# Patient Record
Sex: Female | Born: 1950 | Race: Black or African American | Hispanic: No | Marital: Married | Smoking: Never smoker
Health system: Southern US, Community
[De-identification: ages and names within clinical notes are randomized; demographics above are authoritative.]

## PROBLEM LIST (undated history)

## (undated) DIAGNOSIS — I10 Essential (primary) hypertension: Secondary | ICD-10-CM

## (undated) DIAGNOSIS — D649 Anemia, unspecified: Secondary | ICD-10-CM

## (undated) DIAGNOSIS — I2510 Atherosclerotic heart disease of native coronary artery without angina pectoris: Secondary | ICD-10-CM

## (undated) DIAGNOSIS — E785 Hyperlipidemia, unspecified: Secondary | ICD-10-CM

## (undated) DIAGNOSIS — R51 Headache: Secondary | ICD-10-CM

## (undated) DIAGNOSIS — M1990 Unspecified osteoarthritis, unspecified site: Secondary | ICD-10-CM

## (undated) DIAGNOSIS — R011 Cardiac murmur, unspecified: Secondary | ICD-10-CM

## (undated) DIAGNOSIS — Z460 Encounter for fitting and adjustment of spectacles and contact lenses: Secondary | ICD-10-CM

## (undated) DIAGNOSIS — R079 Chest pain, unspecified: Secondary | ICD-10-CM

## (undated) HISTORY — PX: BUNIONECTOMY: SHX129

## (undated) HISTORY — DX: Anemia, unspecified: D64.9

## (undated) HISTORY — PX: COLONOSCOPY: SHX174

## (undated) HISTORY — DX: Atherosclerotic heart disease of native coronary artery without angina pectoris: I25.10

---

## 2002-10-10 ENCOUNTER — Encounter: Payer: Self-pay | Admitting: Emergency Medicine

## 2002-10-10 ENCOUNTER — Inpatient Hospital Stay (HOSPITAL_COMMUNITY): Admission: EM | Admit: 2002-10-10 | Discharge: 2002-10-12 | Payer: Self-pay | Admitting: Emergency Medicine

## 2002-10-13 ENCOUNTER — Emergency Department (HOSPITAL_COMMUNITY): Admission: EM | Admit: 2002-10-13 | Discharge: 2002-10-13 | Payer: Self-pay | Admitting: Emergency Medicine

## 2004-09-07 ENCOUNTER — Encounter: Admission: RE | Admit: 2004-09-07 | Discharge: 2004-09-07 | Payer: Self-pay | Admitting: Internal Medicine

## 2004-10-08 ENCOUNTER — Emergency Department (HOSPITAL_COMMUNITY): Admission: EM | Admit: 2004-10-08 | Discharge: 2004-10-09 | Payer: Self-pay | Admitting: Emergency Medicine

## 2004-10-24 HISTORY — PX: HAMMERTOE RECONSTRUCTION WITH WEIL OSTEOTOMY: SHX5631

## 2005-10-24 HISTORY — PX: CARDIAC CATHETERIZATION: SHX172

## 2006-03-14 IMAGING — CR DG CHEST 2V
2 series · 2 of 2 positions shown · non-contrast
Comparison: none

CLINICAL DATA: Chest pain.  
 CHEST ? TWO VIEWS 10/08/04:
 The heart size and mediastinal contours are normal. The lungs are clear. The visualized skeleton is unremarkable.

[view not recorded (1 of 2)]
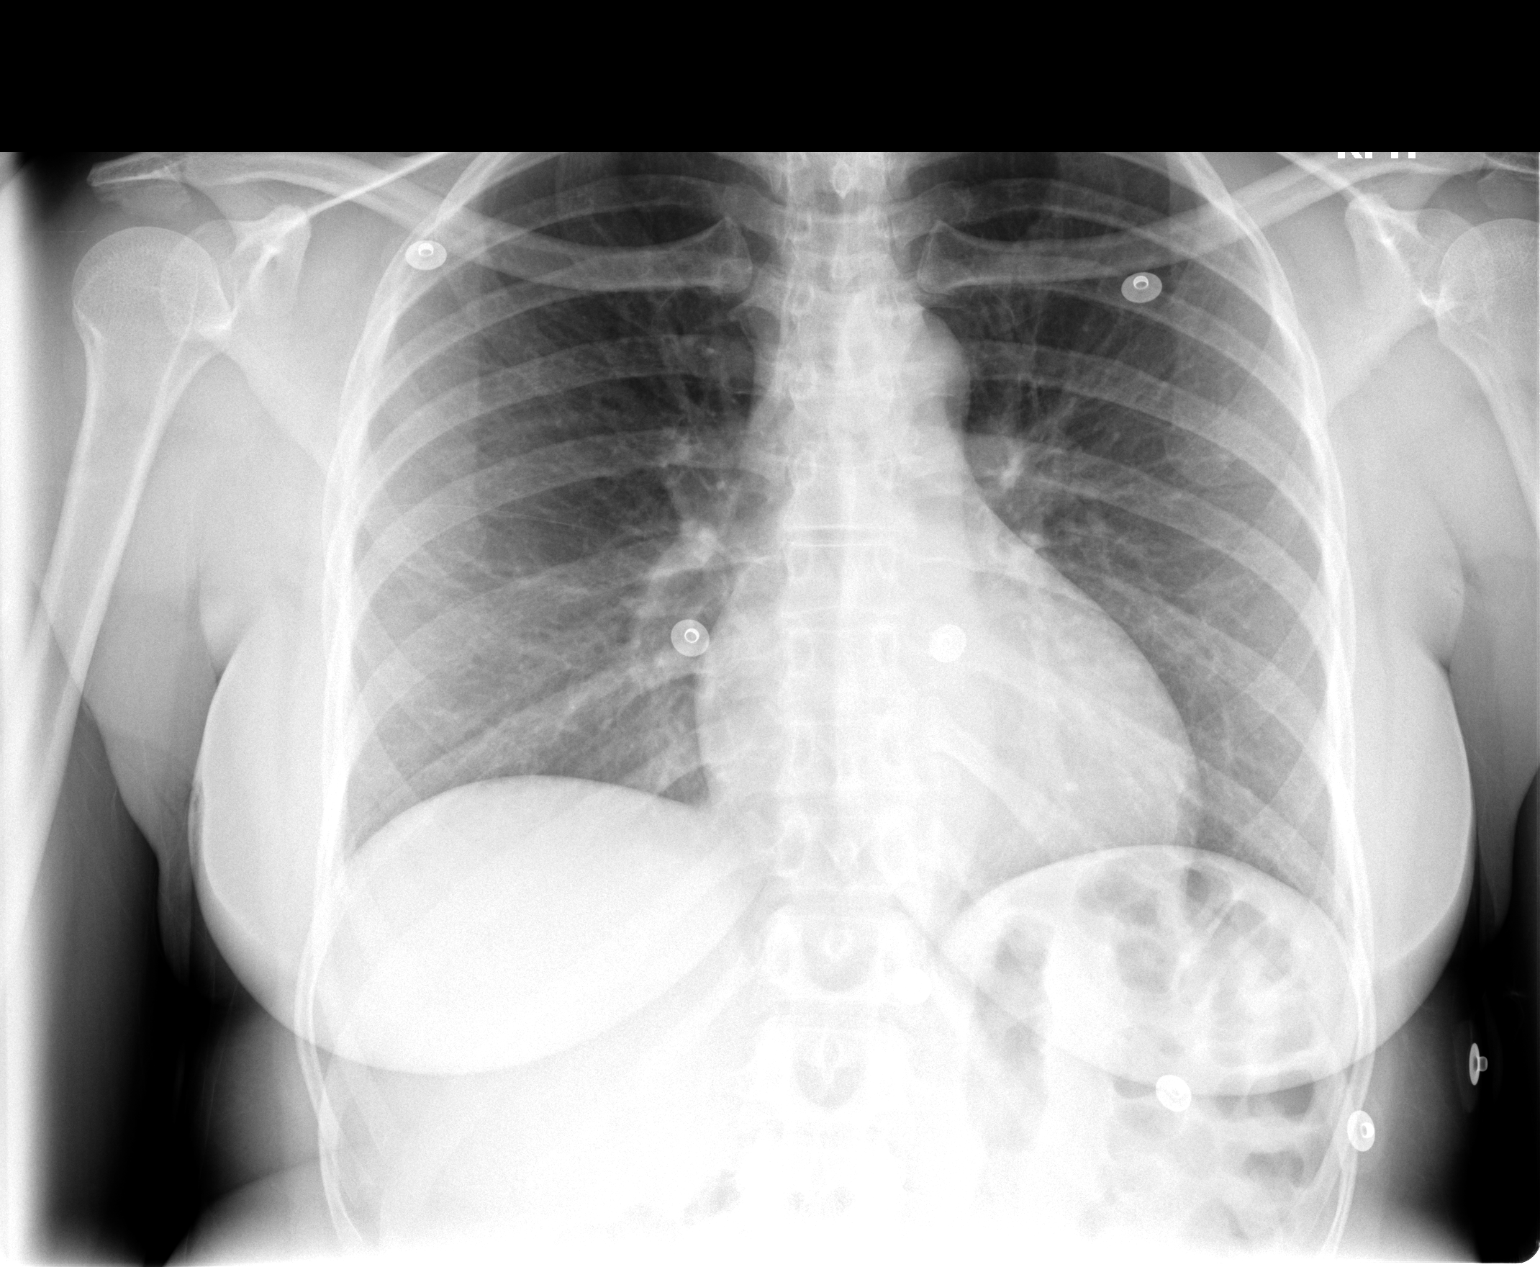

[view not recorded (2 of 2)]
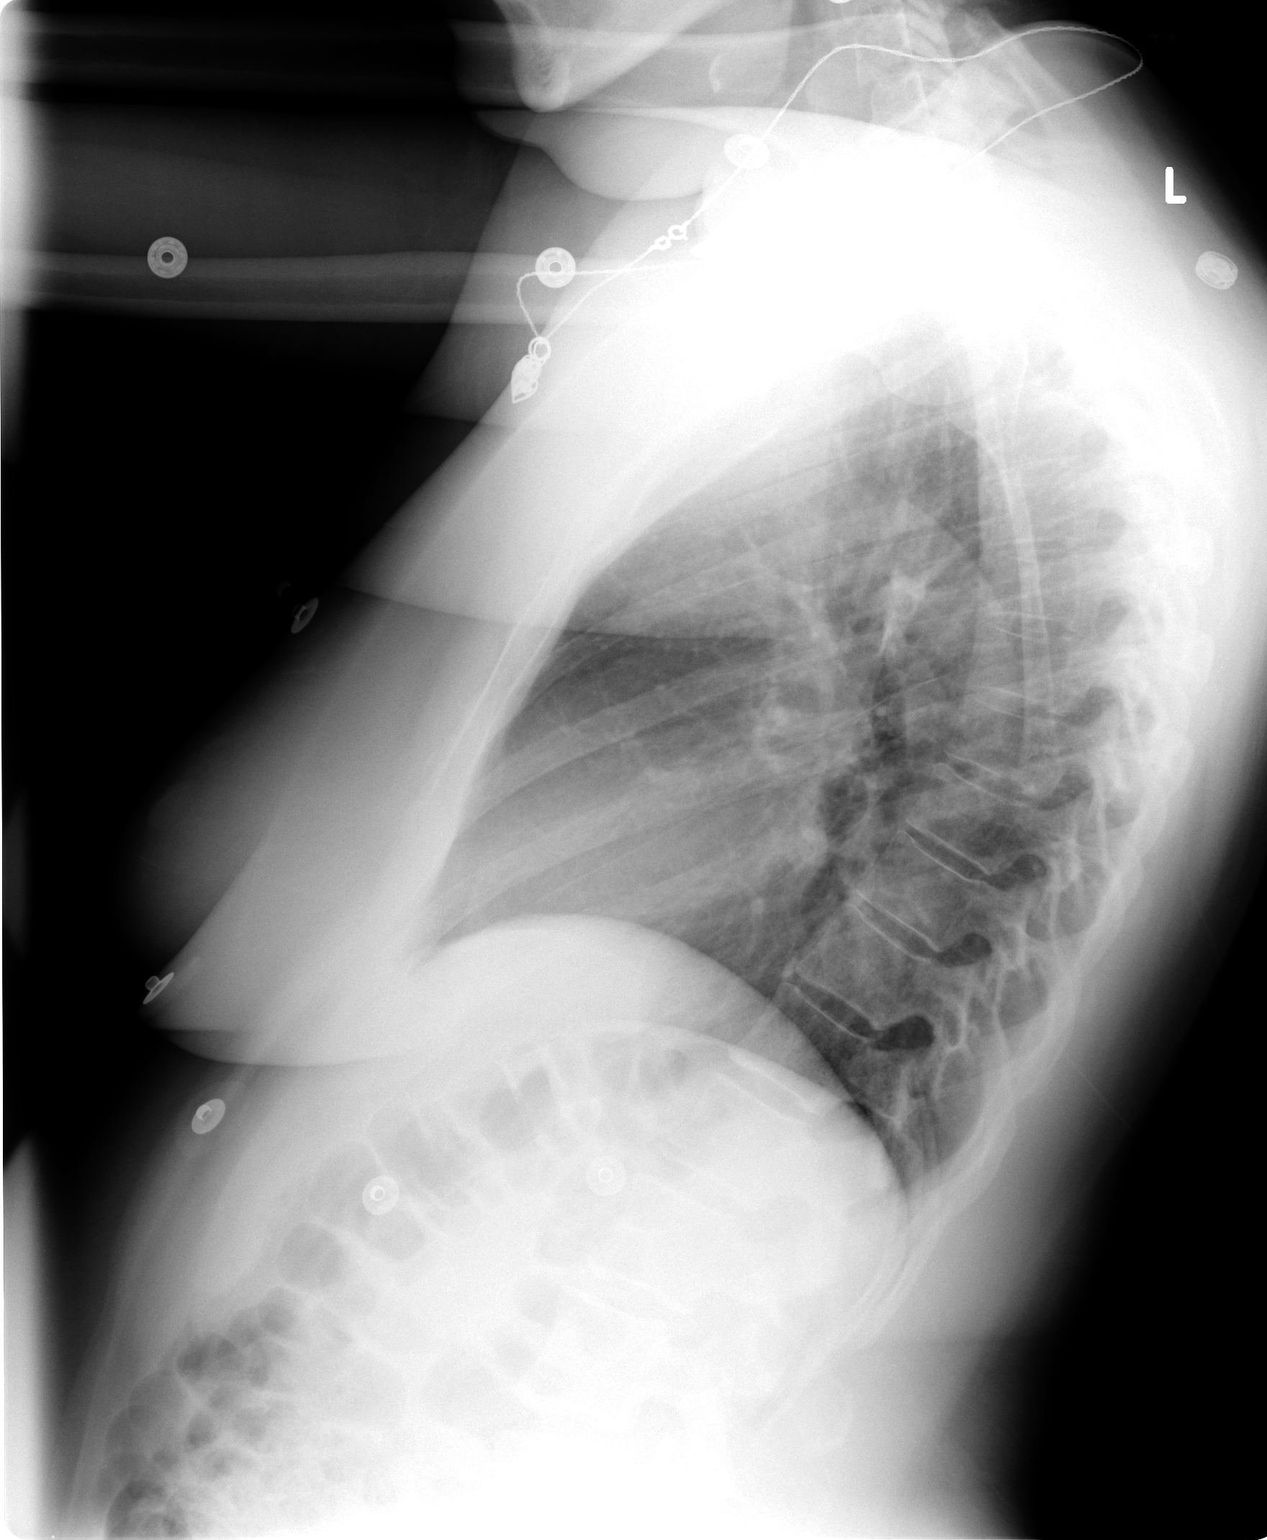

[2 of 2 positions shown; findings below may reference images not displayed]

IMPRESSION: No active disease.

## 2010-06-14 ENCOUNTER — Emergency Department (HOSPITAL_COMMUNITY): Admission: EM | Admit: 2010-06-14 | Discharge: 2010-06-14 | Payer: Self-pay | Admitting: Emergency Medicine

## 2011-01-07 LAB — BASIC METABOLIC PANEL
BUN: 15 mg/dL (ref 6–23)
CO2: 21 mEq/L (ref 19–32)
Calcium: 9.4 mg/dL (ref 8.4–10.5)
Chloride: 110 mEq/L (ref 96–112)
Creatinine, Ser: 0.71 mg/dL (ref 0.4–1.2)
GFR calc Af Amer: >60 mL/min (ref >60)
GFR calc non Af Amer: >60 mL/min (ref >60)
Glucose, Bld: 83 mg/dL (ref 70–99)
Potassium: 3.4 mEq/L — ABNORMAL LOW (ref 3.5–5.1)
Sodium: 137 mEq/L (ref 135–145)

## 2011-01-07 LAB — CBC
HCT: 35.8 % — ABNORMAL LOW (ref 36.0–46.0)
Hemoglobin: 11.8 g/dL — ABNORMAL LOW (ref 12.0–15.0)
MCH: 27.5 pg (ref 26.0–34.0)
MCHC: 32.8 g/dL (ref 30.0–36.0)
MCV: 83.8 fL (ref 78.0–100.0)
Platelets: 461 10*3/uL — ABNORMAL HIGH (ref 150–400)
RBC: 4.27 MIL/uL (ref 3.87–5.11)
RDW: 13.1 % (ref 11.5–15.5)
WBC: 7.1 10*3/uL (ref 4.0–10.5)

## 2011-01-07 LAB — DIFFERENTIAL
Basophils Absolute: 0.1 10*3/uL (ref 0.0–0.1)
Basophils Relative: 2 % — ABNORMAL HIGH (ref 0–1)
Eosinophils Absolute: 0.3 10*3/uL (ref 0.0–0.7)
Eosinophils Relative: 4 % (ref 0–5)
Lymphocytes Relative: 41 % (ref 12–46)
Lymphs Abs: 2.9 10*3/uL (ref 0.7–4.0)
Monocytes Absolute: 0.6 10*3/uL (ref 0.1–1.0)
Monocytes Relative: 8 % (ref 3–12)
Neutro Abs: 3.2 10*3/uL (ref 1.7–7.7)
Neutrophils Relative %: 46 % (ref 43–77)

## 2011-01-07 LAB — POCT CARDIAC MARKERS
CKMB, poc: <1 ng/mL — ABNORMAL LOW (ref 1.0–8.0)
Myoglobin, poc: 63.4 ng/mL (ref 12–200)
Troponin i, poc: <0.05 ng/mL (ref 0.00–0.09)

## 2011-01-07 LAB — GLUCOSE, CAPILLARY: Glucose-Capillary: 89 mg/dL (ref 70–99)

## 2012-09-24 ENCOUNTER — Encounter (HOSPITAL_BASED_OUTPATIENT_CLINIC_OR_DEPARTMENT_OTHER): Payer: Self-pay | Admitting: *Deleted

## 2012-09-24 NOTE — Progress Notes (Signed)
Pt saw cardiology 2007 had cath andnecho for murmur-went to [facility]-cannot remember why- Has not seen cardiology in 31yr- Was told she would stay [facility]-to bring all meds-overnight bag-will call pcp and cardiology in [place] for notes

## 2012-09-26 ENCOUNTER — Encounter (HOSPITAL_BASED_OUTPATIENT_CLINIC_OR_DEPARTMENT_OTHER): Payer: Self-pay | Admitting: Anesthesiology

## 2012-09-26 ENCOUNTER — Ambulatory Visit (HOSPITAL_BASED_OUTPATIENT_CLINIC_OR_DEPARTMENT_OTHER)
Admission: RE | Admit: 2012-09-26 | Discharge: 2012-09-27 | Disposition: A | Discharge status: Home / Self Care | Payer: BC Managed Care – PPO | Source: Ambulatory Visit | Attending: Orthopedic Surgery | Admitting: Orthopedic Surgery

## 2012-09-26 ENCOUNTER — Encounter (HOSPITAL_BASED_OUTPATIENT_CLINIC_OR_DEPARTMENT_OTHER)
Admission: RE | Disposition: A | Discharge status: Home / Self Care | Payer: Self-pay | Source: Ambulatory Visit | Attending: Orthopedic Surgery

## 2012-09-26 ENCOUNTER — Ambulatory Visit (HOSPITAL_BASED_OUTPATIENT_CLINIC_OR_DEPARTMENT_OTHER): Payer: BC Managed Care – PPO | Admitting: Anesthesiology

## 2012-09-26 ENCOUNTER — Encounter (HOSPITAL_BASED_OUTPATIENT_CLINIC_OR_DEPARTMENT_OTHER): Payer: Self-pay

## 2012-09-26 DIAGNOSIS — M7750 Other enthesopathy of unspecified foot: Secondary | ICD-10-CM | POA: Insufficient documentation

## 2012-09-26 DIAGNOSIS — M79609 Pain in unspecified limb: Secondary | ICD-10-CM | POA: Insufficient documentation

## 2012-09-26 DIAGNOSIS — M6240 Contracture of muscle, unspecified site: Secondary | ICD-10-CM | POA: Insufficient documentation

## 2012-09-26 DIAGNOSIS — M2040 Other hammer toe(s) (acquired), unspecified foot: Secondary | ICD-10-CM | POA: Insufficient documentation

## 2012-09-26 DIAGNOSIS — M79672 Pain in left foot: Secondary | ICD-10-CM

## 2012-09-26 HISTORY — DX: Headache: R51

## 2012-09-26 HISTORY — DX: Hyperlipidemia, unspecified: E78.5

## 2012-09-26 HISTORY — PX: HAMMERTOE RECONSTRUCTION WITH WEIL OSTEOTOMY AND GASTROC SLIDE: SHX5632

## 2012-09-26 HISTORY — DX: Cardiac murmur, unspecified: R01.1

## 2012-09-26 HISTORY — DX: Essential (primary) hypertension: I10

## 2012-09-26 HISTORY — DX: Encounter for fitting and adjustment of spectacles and contact lenses: Z46.0

## 2012-09-26 HISTORY — DX: Unspecified osteoarthritis, unspecified site: M19.90

## 2012-09-26 LAB — POCT I-STAT, CHEM 8
BUN: 16 mg/dL (ref 6–23)
Calcium, Ion: 1.12 mmol/L (ref 1.12–1.23)
Chloride: 107 mEq/L (ref 96–112)
Creatinine, Ser: 0.9 mg/dL (ref 0.50–1.10)
Glucose, Bld: 94 mg/dL (ref 70–99)
HCT: 33 % — ABNORMAL LOW (ref 36.0–46.0)
Hemoglobin: 11.2 g/dL — ABNORMAL LOW (ref 12.0–15.0)
Potassium: 3.2 mEq/L — ABNORMAL LOW (ref 3.5–5.1)
Sodium: 144 mEq/L (ref 135–145)
TCO2: 24 mmol/L (ref 0–100)

## 2012-09-26 SURGERY — HAMMERTOE RECONSTRUCTION WITH WEIL OSTEOTOMY AND GASTROC SLIDE
Anesthesia: Regional | Site: Foot | Laterality: Left

## 2012-09-26 MED ORDER — METOPROLOL TARTRATE 25 MG PO TABS
25.0000 mg | ORAL_TABLET | Freq: Two times a day (BID) | ORAL | Status: DC
  Administered 2012-09-26: 25 mg via ORAL

## 2012-09-26 MED ORDER — DEXAMETHASONE SODIUM PHOSPHATE 4 MG/ML IJ SOLN: INTRAMUSCULAR | Status: DC | PRN

## 2012-09-26 MED ORDER — SODIUM CHLORIDE 0.9 % IV SOLN
INTRAVENOUS | Status: DC
  Administered 2012-09-26: 50 mL/h via INTRAVENOUS

## 2012-09-26 MED ORDER — ONDANSETRON HCL 4 MG PO TABS: 4.0000 mg | ORAL_TABLET | Freq: Four times a day (QID) | ORAL | Status: DC | PRN

## 2012-09-26 MED ORDER — MULTI-VITAMIN/MINERALS PO TABS: 1.0000 | ORAL_TABLET | Freq: Every day | ORAL | Status: DC

## 2012-09-26 MED ORDER — LACTATED RINGERS IV SOLN
INTRAVENOUS | Status: DC
Start: 2012-09-26 — End: 2012-09-26
  Administered 2012-09-26 (×2): via INTRAVENOUS

## 2012-09-26 MED ORDER — CHLORHEXIDINE GLUCONATE 4 % EX LIQD: 60.0000 mL | Freq: Once | CUTANEOUS | Status: DC

## 2012-09-26 MED ORDER — ZOLPIDEM TARTRATE 5 MG PO TABS
5.0000 mg | ORAL_TABLET | Freq: Every evening | ORAL | Status: DC | PRN
  Administered 2012-09-26: 5 mg via ORAL

## 2012-09-26 MED ORDER — SIMVASTATIN 20 MG PO TABS: 20.0000 mg | ORAL_TABLET | Freq: Every evening | ORAL | Status: DC

## 2012-09-26 MED ORDER — METHOCARBAMOL 100 MG/ML IJ SOLN
500.0000 mg | Freq: Four times a day (QID) | INTRAVENOUS | Status: DC | PRN
  Administered 2012-09-26: 500 mg via INTRAVENOUS

## 2012-09-26 MED ORDER — OXYCODONE HCL 5 MG PO TABS
5.0000 mg | ORAL_TABLET | Freq: Once | ORAL | Status: AC | PRN
  Administered 2012-09-26: 5 mg via ORAL

## 2012-09-26 MED ORDER — EPHEDRINE SULFATE 50 MG/ML IJ SOLN
INTRAMUSCULAR | Status: DC | PRN
  Administered 2012-09-26 (×2): 5 mg via INTRAVENOUS

## 2012-09-26 MED ORDER — LIDOCAINE HCL (CARDIAC) 20 MG/ML IV SOLN
INTRAVENOUS | Status: DC | PRN
  Administered 2012-09-26 (×2): 30 mg via INTRAVENOUS

## 2012-09-26 MED ORDER — SODIUM CHLORIDE 0.9 % IV SOLN: INTRAVENOUS | Status: DC

## 2012-09-26 MED ORDER — METOCLOPRAMIDE HCL 5 MG/ML IJ SOLN
INTRAMUSCULAR | Status: DC | PRN
  Administered 2012-09-26: 10 mg via INTRAVENOUS

## 2012-09-26 MED ORDER — BUPIVACAINE-EPINEPHRINE PF 0.5-1:200000 % IJ SOLN
INTRAMUSCULAR | Status: DC | PRN
  Administered 2012-09-26: 30 mL

## 2012-09-26 MED ORDER — MORPHINE SULFATE 2 MG/ML IJ SOLN: 1.0000 mg | INTRAMUSCULAR | Status: DC | PRN

## 2012-09-26 MED ORDER — ONDANSETRON HCL 4 MG/2ML IJ SOLN
INTRAMUSCULAR | Status: DC | PRN
  Administered 2012-09-26: 4 mg via INTRAVENOUS

## 2012-09-26 MED ORDER — METOCLOPRAMIDE HCL 5 MG PO TABS: 5.0000 mg | ORAL_TABLET | Freq: Three times a day (TID) | ORAL | Status: DC | PRN

## 2012-09-26 MED ORDER — TOPIRAMATE 25 MG PO CPSP
25.0000 mg | ORAL_CAPSULE | Freq: Two times a day (BID) | ORAL | Status: DC
  Administered 2012-09-26: 25 mg via ORAL

## 2012-09-26 MED ORDER — VITAMIN C 500 MG PO TABS: 500.0000 mg | ORAL_TABLET | Freq: Every day | ORAL | Status: DC

## 2012-09-26 MED ORDER — OXYCODONE-ACETAMINOPHEN 5-325 MG PO TABS
1.0000 | ORAL_TABLET | ORAL | Status: DC | PRN
  Administered 2012-09-26 – 2012-09-27 (×2): 1 via ORAL

## 2012-09-26 MED ORDER — FENTANYL CITRATE 0.05 MG/ML IJ SOLN
50.0000 ug | INTRAMUSCULAR | Status: DC | PRN
  Administered 2012-09-26: 100 ug via INTRAVENOUS

## 2012-09-26 MED ORDER — MIDAZOLAM HCL 2 MG/2ML IJ SOLN
0.5000 mg | INTRAMUSCULAR | Status: DC | PRN
  Administered 2012-09-26: 2 mg via INTRAVENOUS

## 2012-09-26 MED ORDER — ACETAMINOPHEN 10 MG/ML IV SOLN
1000.0000 mg | Freq: Once | INTRAVENOUS | Status: AC
  Administered 2012-09-26: 1000 mg via INTRAVENOUS

## 2012-09-26 MED ORDER — CEFAZOLIN SODIUM 1-5 GM-% IV SOLN
1.0000 g | Freq: Three times a day (TID) | INTRAVENOUS | Status: DC
  Administered 2012-09-26 – 2012-09-27 (×2): 1 g via INTRAVENOUS

## 2012-09-26 MED ORDER — BUPIVACAINE HCL (PF) 0.5 % IJ SOLN
INTRAMUSCULAR | Status: DC | PRN
  Administered 2012-09-26: 10 mL

## 2012-09-26 MED ORDER — METHOCARBAMOL 500 MG PO TABS: 500.0000 mg | ORAL_TABLET | Freq: Three times a day (TID) | ORAL | Status: DC

## 2012-09-26 MED ORDER — CEFAZOLIN SODIUM-DEXTROSE 2-3 GM-% IV SOLR
2.0000 g | INTRAVENOUS | Status: AC
  Administered 2012-09-26: 2 g via INTRAVENOUS

## 2012-09-26 MED ORDER — PROPOFOL 10 MG/ML IV BOLUS
INTRAVENOUS | Status: DC | PRN
  Administered 2012-09-26: 200 mg via INTRAVENOUS
  Administered 2012-09-26: 50 mg via INTRAVENOUS
  Administered 2012-09-26: 30 mg via INTRAVENOUS

## 2012-09-26 MED ORDER — HYDROMORPHONE HCL PF 1 MG/ML IJ SOLN
0.2500 mg | INTRAMUSCULAR | Status: DC | PRN
  Administered 2012-09-26 (×4): 0.5 mg via INTRAVENOUS

## 2012-09-26 MED ORDER — OXYCODONE-ACETAMINOPHEN 5-325 MG PO TABS
1.0000 | ORAL_TABLET | ORAL | Status: DC | PRN
Start: 2012-09-26 — End: 2013-02-16

## 2012-09-26 MED ORDER — METHOCARBAMOL 500 MG PO TABS
500.0000 mg | ORAL_TABLET | Freq: Four times a day (QID) | ORAL | Status: DC | PRN
  Administered 2012-09-26 – 2012-09-27 (×2): 500 mg via ORAL

## 2012-09-26 MED ORDER — METOCLOPRAMIDE HCL 5 MG/ML IJ SOLN: 5.0000 mg | Freq: Three times a day (TID) | INTRAMUSCULAR | Status: DC | PRN

## 2012-09-26 MED ORDER — SUCCINYLCHOLINE CHLORIDE 20 MG/ML IJ SOLN
INTRAMUSCULAR | Status: DC | PRN
  Administered 2012-09-26: 40 mg via INTRAVENOUS

## 2012-09-26 MED ORDER — OXYCODONE HCL 5 MG/5ML PO SOLN: 5.0000 mg | Freq: Once | ORAL | Status: AC | PRN

## 2012-09-26 MED ORDER — DEXAMETHASONE SODIUM PHOSPHATE 4 MG/ML IJ SOLN
INTRAMUSCULAR | Status: DC | PRN
  Administered 2012-09-26: 10 mg via INTRAVENOUS

## 2012-09-26 MED ORDER — DIPHENHYDRAMINE HCL 12.5 MG/5ML PO ELIX: 12.5000 mg | ORAL_SOLUTION | ORAL | Status: DC | PRN

## 2012-09-26 MED ORDER — ONDANSETRON HCL 4 MG/2ML IJ SOLN: 4.0000 mg | Freq: Four times a day (QID) | INTRAMUSCULAR | Status: DC | PRN

## 2012-09-26 SURGICAL SUPPLY — 65 items
APL SKNCLS STERI-STRIP NONHPOA (GAUZE/BANDAGES/DRESSINGS)
BANDAGE ELASTIC 4 VELCRO ST LF (GAUZE/BANDAGES/DRESSINGS) ×2 IMPLANT
BENZOIN TINCTURE PRP APPL 2/3 (GAUZE/BANDAGES/DRESSINGS) IMPLANT
BIT DRILL 1.3 CANN ENDOSCOPIC (BIT) ×2 IMPLANT
BLADE ARTHRO LOK 4 BEAVER (BLADE) ×2 IMPLANT
BLADE AVERAGE 25X9 (BLADE) ×1 IMPLANT
BLADE CCA MICRO SAG (BLADE) ×4 IMPLANT
BLADE MICRO SAGITTAL (BLADE) ×2 IMPLANT
BLADE SURG 11 STRL SS (BLADE) ×2 IMPLANT
BLADE SURG 15 STRL LF DISP TIS (BLADE) ×4 IMPLANT
BLADE SURG 15 STRL SS (BLADE) ×8
BRUSH SCRUB EZ PLAIN DRY (MISCELLANEOUS) ×2 IMPLANT
CAP PIN PROTECTOR ORTHO WHT (CAP) ×2 IMPLANT
CLOTH BEACON ORANGE TIMEOUT ST (SAFETY) ×2 IMPLANT
COVER TABLE BACK 60X90 (DRAPES) ×2 IMPLANT
CUFF TOURNIQUET SINGLE 34IN LL (TOURNIQUET CUFF) ×2 IMPLANT
DRAPE EXTREMITY T 121X128X90 (DRAPE) ×2 IMPLANT
DRAPE OEC MINIVIEW 54X84 (DRAPES) ×2 IMPLANT
DRAPE SURG 17X23 STRL (DRAPES) ×2 IMPLANT
DRSG PAD ABDOMINAL 8X10 ST (GAUZE/BANDAGES/DRESSINGS) ×2 IMPLANT
DURA STEPPER LG (CAST SUPPLIES) IMPLANT
DURA STEPPER MED (CAST SUPPLIES) IMPLANT
DURA STEPPER SML (CAST SUPPLIES) IMPLANT
ELECT REM PT RETURN 9FT ADLT (ELECTROSURGICAL) ×2
ELECTRODE REM PT RTRN 9FT ADLT (ELECTROSURGICAL) ×1 IMPLANT
GAUZE SPONGE 4X4 16PLY XRAY LF (GAUZE/BANDAGES/DRESSINGS) IMPLANT
GAUZE XEROFORM 1X8 LF (GAUZE/BANDAGES/DRESSINGS) IMPLANT
GAUZE XEROFORM 5X9 LF (GAUZE/BANDAGES/DRESSINGS) ×1 IMPLANT
GLOVE BIO SURGEON STRL SZ8 (GLOVE) ×2 IMPLANT
GLOVE BIOGEL PI IND STRL 8 (GLOVE) ×2 IMPLANT
GLOVE BIOGEL PI INDICATOR 8 (GLOVE) ×2
GLOVE SURG SS PI 8.0 STRL IVOR (GLOVE) ×2 IMPLANT
GOWN BRE IMP PREV XXLGXLNG (GOWN DISPOSABLE) ×4 IMPLANT
GOWN PREVENTION PLUS XLARGE (GOWN DISPOSABLE) ×2 IMPLANT
K-WIRE .045X4 (WIRE) ×4 IMPLANT
KWIRE 4.0 X .045IN (WIRE) IMPLANT
NEEDLE HYPO 22GX1.5 SAFETY (NEEDLE) IMPLANT
NS IRRIG 1000ML POUR BTL (IV SOLUTION) ×2 IMPLANT
PACK BASIN DAY SURGERY FS (CUSTOM PROCEDURE TRAY) ×2 IMPLANT
PAD CAST 4YDX4 CTTN HI CHSV (CAST SUPPLIES) ×1 IMPLANT
PADDING CAST COTTON 4X4 STRL (CAST SUPPLIES) ×2
PENCIL BUTTON HOLSTER BLD 10FT (ELECTRODE) ×2 IMPLANT
SCREW QUICK FIX 2X12 (Screw) ×1 IMPLANT
SCREW QUICK FIX 2X14 (Screw) ×1 IMPLANT
SHEET MEDIUM DRAPE 40X70 STRL (DRAPES) ×4 IMPLANT
SLEEVE SCD COMPRESS KNEE MED (MISCELLANEOUS) IMPLANT
SPONGE GAUZE 4X4 12PLY (GAUZE/BANDAGES/DRESSINGS) ×2 IMPLANT
STOCKINETTE 6  STRL (DRAPES) ×1
STOCKINETTE 6 STRL (DRAPES) ×1 IMPLANT
STRIP CLOSURE SKIN 1/2X4 (GAUZE/BANDAGES/DRESSINGS) ×1 IMPLANT
SUCTION FRAZIER TIP 10 FR DISP (SUCTIONS) ×2 IMPLANT
SUT BONE WAX W31G (SUTURE) ×2 IMPLANT
SUT ETHILON 4 0 PS 2 18 (SUTURE) ×6 IMPLANT
SUT MON AB 4-0 PC3 18 (SUTURE) IMPLANT
SUT PDS AB 4-0 P3 18 (SUTURE) ×2 IMPLANT
SUT VIC AB 2-0 PS2 27 (SUTURE) IMPLANT
SUT VIC AB 3-0 PS1 18 (SUTURE) ×6
SUT VIC AB 3-0 PS1 18XBRD (SUTURE) ×3 IMPLANT
SYR BULB 3OZ (MISCELLANEOUS) ×2 IMPLANT
SYR CONTROL 10ML LL (SYRINGE) IMPLANT
TOWEL OR 17X24 6PK STRL BLUE (TOWEL DISPOSABLE) ×6 IMPLANT
TOWEL OR NON WOVEN STRL DISP B (DISPOSABLE) ×2 IMPLANT
TUBE CONNECTING 20X1/4 (TUBING) ×2 IMPLANT
UNDERPAD 30X30 INCONTINENT (UNDERPADS AND DIAPERS) ×2 IMPLANT
WATER STERILE IRR 1000ML POUR (IV SOLUTION) ×2 IMPLANT

## 2012-09-26 NOTE — Brief Op Note (Signed)
09/26/2012  10:36 AM  PATIENT:  Brandi Russell  61 y.o. female  PRE-OPERATIVE DIAGNOSIS:  LEFT TIGHT GASTROC, 2ND/5TH METATARSALGIA, HAMMER TOES  POST-OPERATIVE DIAGNOSIS:  left tight gastroc 2nd/5th metatarsalgia, hammertoes  PROCEDURE:  Procedure(s) (LRB) with comments: HAMMERTOE RECONSTRUCTION WITH WEIL OSTEOTOMY AND GASTROC SLIDE (Left) - LEFT GASTROC SLIDE, 2ND/3RD WEIL METATARSAL SHORTING OSTEOTOMIES, 2ND through 5th TOES MTP JOINT, DORSAL CAPSULOTOMY, HAMMER TOE RECONSTRUCTION,  FDL TO PROXIMAL PHALANX TENDON TRANSFER  SURGEON:  Surgeon(s) and Role:    * Sherri Rad, MD - Primary  PHYSICIAN ASSISTANT: Rexene Edison, PAC   ASSISTANTS: Rexene Edison, [facility]    ANESTHESIA:   general  EBL:  Total I/O In: 1000 [I.V.:1000] Out: -   BLOOD ADMINISTERED:none  DRAINS: none   LOCAL MEDICATIONS USED:  NONE  SPECIMEN:  No Specimen  DISPOSITION OF SPECIMEN:  N/A  COUNTS:  YES  TOURNIQUET:  * Missing tourniquet times found for documented tourniquets in log:  [id number] *  DICTATION: .Other Dictation: Dictation Number [id number]  PLAN OF CARE: Admit for overnight observation  PATIENT DISPOSITION:  PACU - hemodynamically stable.   Delay start of Pharmacological VTE agent (>24hrs) due to surgical blood loss or risk of bleeding: no

## 2012-09-26 NOTE — Anesthesia Preprocedure Evaluation (Addendum)
Anesthesia Evaluation  Patient identified by MRN, date of birth, ID band Patient awake    Reviewed: Allergy & Precautions, H&P , NPO status , Patient's Chart, lab work & pertinent test results, reviewed documented beta blocker date and time   Airway Mallampati: II TM Distance: >3 FB Neck ROM: Full    Dental No notable dental hx. (+) Teeth Intact and Dental Advisory Given   Pulmonary neg pulmonary ROS,  breath sounds clear to auscultation  Pulmonary exam normal       Cardiovascular hypertension, On Home Beta Blockers and On Medications Rhythm:Regular Rate:Normal     Neuro/Psych  Headaches, negative psych ROS   GI/Hepatic negative GI ROS, Neg liver ROS,   Endo/Other  negative endocrine ROS  Renal/GU negative Renal ROS  negative genitourinary   Musculoskeletal   Abdominal   Peds  Hematology negative hematology ROS (+)   Anesthesia Other Findings   Reproductive/Obstetrics negative OB ROS                          Anesthesia Physical Anesthesia Plan  ASA: II  Anesthesia Plan: General and Regional   Post-op Pain Management:    Induction: Intravenous  Airway Management Planned: LMA  Additional Equipment:   Intra-op Plan:   Post-operative Plan: Extubation in OR  Informed Consent: I have reviewed the patients History and Physical, chart, labs and discussed the procedure including the risks, benefits and alternatives for the proposed anesthesia with the patient or authorized representative who has indicated his/her understanding and acceptance.   Dental advisory given  Plan Discussed with: CRNA and Surgeon  Anesthesia Plan Comments:         Anesthesia Quick Evaluation

## 2012-09-26 NOTE — Anesthesia Procedure Notes (Addendum)
Anesthesia Regional Block:  Popliteal block  Pre-Anesthetic Checklist: ,, timeout performed, Correct Patient, Correct Site, Correct Laterality, Correct Procedure, Correct Position, site marked, Risks and benefits discussed, pre-op evaluation, post-op pain management  Laterality: Left  Prep: Maximum Sterile Barrier Precautions used and chloraprep       Needles:  Injection technique: Single-shot  Needle Type: Echogenic Stimulator Needle      Needle Gauge: 22 and 22 G    Additional Needles:  Procedures: ultrasound guided (picture in chart) and nerve stimulator Popliteal block  Nerve Stimulator or Paresthesia:  Response: Peroneal,  Response: Tibial,   Additional Responses:   Narrative:  Start time: 09/26/2012 8:10 AM End time: 09/26/2012 8:22 AM Injection made incrementally with aspirations every 5 mL. Anesthesiologist: Sampson Goon, MD  Additional Notes: 2% Lidocaine skin wheel. Saphenous block above the knee with 10cc of 0.5% Bupivicaine plain.  Popliteal block Procedure Name: LMA Insertion Date/Time: 09/26/2012 8:44 AM Performed by: Norva Pavlov Pre-anesthesia Checklist: Patient identified, Emergency Drugs available, Suction available and Patient being monitored Patient Re-evaluated:Patient Re-evaluated prior to inductionOxygen Delivery Method: Circle System Utilized Preoxygenation: Pre-oxygenation with 100% oxygen Intubation Type: IV induction Ventilation: Mask ventilation without difficulty LMA: LMA inserted LMA Size: 4.0 Number of attempts: 1 Airway Equipment and Method: bite block Placement Confirmation: positive ETCO2 Tube secured with: Tape Dental Injury: Teeth and Oropharynx as per pre-operative assessment

## 2012-09-26 NOTE — H&P (Signed)
  H&P documentation: Placed to be scanned history and physical exam in chart.  -History and Physical Reviewed  -Patient has been re-examined  -No change in the plan of care  Khole Branch A  

## 2012-09-26 NOTE — Anesthesia Postprocedure Evaluation (Signed)
  Anesthesia Post-op Note  Patient: Brandi Russell  Procedure(s) Performed: Procedure(s) (LRB) with comments: HAMMERTOE RECONSTRUCTION WITH WEIL OSTEOTOMY AND GASTROC SLIDE (Left) - LEFT GASTROC SLIDE, 2ND/3RD WEIL METATARSAL SHORTING OSTEOTOMIES, 2ND through 5th TOES MTP JOINT, DORSAL CAPSULOTOMY, HAMMER TOE RECONSTRUCTION,  FDL TO PROXIMAL PHALANX TENDON TRANSFER  Patient Location: PACU  Anesthesia Type:GA combined with regional for post-op pain  Level of Consciousness: awake and alert   Airway and Oxygen Therapy: Patient Spontanous Breathing and Patient connected to face mask oxygen  Post-op Pain: mild  Post-op Assessment: Post-op Vital signs reviewed, Patient's Cardiovascular Status Stable, Respiratory Function Stable, Patent Airway and No signs of Nausea or vomiting  Post-op Vital Signs: Reviewed and stable  Complications: No apparent anesthesia complications

## 2012-09-26 NOTE — Transfer of Care (Signed)
Immediate Anesthesia Transfer of Care Note  Patient: Brandi Russell  Procedure(s) Performed: Procedure(s) (LRB): HAMMERTOE RECONSTRUCTION WITH WEIL OSTEOTOMY AND GASTROC SLIDE (Left)  Patient Location: PACU  Anesthesia Type: General with regional   Level of Consciousness: drowsy, arouses to name, follows commands  Airway & Oxygen Therapy: Patient Spontanous Breathing and Patient connected to face mask oxygen  Post-op Assessment: Report given to PACU RN and Post -op Vital signs reviewed and stable  Post vital signs: Reviewed and stable  Complications: No apparent anesthesia complications

## 2012-09-27 ENCOUNTER — Encounter (HOSPITAL_BASED_OUTPATIENT_CLINIC_OR_DEPARTMENT_OTHER): Payer: Self-pay | Admitting: Orthopedic Surgery

## 2012-09-27 NOTE — Op Note (Signed)
NAME:  Brandi Russell, KRONICK                ACCOUNT NO.:  [id number]  MEDICAL RECORD NO.:  [id number]  LOCATION:                                 FACILITY:  PHYSICIAN:  Leonides Grills, M.D.     DATE OF BIRTH:  15-Apr-1951  DATE OF PROCEDURE:  09/26/2012 DATE OF DISCHARGE:                              OPERATIVE REPORT   PREOPERATIVE DIAGNOSES: 1. Left tight gastroc. 2. Left 2nd and 3rd metatarsalgia. 3. Left 2nd through 5th hammer toes.  POSTOPERATIVE DIAGNOSES: 1. Left tight gastroc. 2. Left 2nd and 3rd metatarsalgia. 3. Left 2nd through 5th hammer toes.  OPERATION: 1. Left gastroc slide. 2. Left 2nd and 3rd Weil metatarsal shortening osteotomies. 3. Stress x-rays, left foot. 4. Left 2nd through 5th toes MTP joint dorsal capsulotomy collateral     release. 5. Left 2nd through 5th hammertoe reconstructions.  ANESTHESIA:  General.  SURGEON:  Leonides Grills, MD  ASSISTANT:  Richardean Canal, PA-C  ESTIMATED BLOOD LOSS:  Minimal.  TOURNIQUET TIME:  Approximately an hour and a half.  COMPLICATIONS:  None.  DISPOSITION:  Stable to PR.  INDICATION:  This is a 61 year old female who has had longstanding left forefoot pain due to the above pathology that was interfering with her life to the point where she cannot do what she wants to despite conservative management.  She was consented for the above procedure. All risks of infection, vessel injury, nonunion, malunion, hardware irritation, hardware failure, persistent pain, worse pain, prolonged recovery, stiffness, arthritis, cock-up toe deformity, the fact that her hallux valgus deformity may be comminution that may require reconstruction down the road from her previous bunionectomy, wound healing problems, DVT, PE were all explained.  Questions were encouraged and answered.  She was also explained that I am leaving the practice on January 18, 2013 as well and Dr. Victorino Dike will be taking over my practice.  OPERATION:  The patient was  brought to the operating room and placed in supine position after adequate general anesthesia was administered and popliteal block as well as Ancef 1 g IV piggyback.  The left lower extremity was then prepped and draped in a sterile manner over proximally placed thigh tourniquet.  We started the procedure with a longitudinal incision on the medial aspect of the gastrocnemius musculotendinous junction.  Dissection was carried down through skin. Hemostasis was obtained.  Fascia was opened in line of the incision. Conjoined region was then developed to the gastroc soleus.  Soft tissue was elevated off the posterior aspect.  The gastrocnemius and sural nerve was identified and protected posteriorly throughout the case. Gastrocnemius was then released with curved Mayo scissors protecting the sural nerve at all times.  This had external release of tight gastroc. The area was copiously irrigated with normal saline.  Subcu was closed with 3-0 Vicryl.  Skin was closed with 4-0 Monocryl subcuticular stitch. Steri-Strips were applied.  Limb was then gravity exsanguinated. Tourniquet was elevated to 290 mmHg.  A longitudinal incision over dorsal aspects of the left 2nd 3rd, 4th, and 5th toes were then made and essentially same procedure was carried down for each toe through separate incisions.  Once we carefully dissected out the  EDB and EDL tendon, EDL was tenotomized proximal medial and brevis distal lateral and retracted out of harm's way for later transfer.  MTP joint dorsal capsulotomy with collateral release was then performed with a 15 blade scalpel and has had external release tight capsule.  Again this was done for 1st, 2nd, 3rd, 4th, and 5th toes respectively.  We then dissected out the distal aspect of proximal phalanx head region.  Once this head was dissected out, the head was then removed with a rongeur followed by bone cutter.  This cut was made perpendicular to the long axis  of proximal phalanx.  Once this was done, we did this for the 2nd, 3rd, 4th toes, and 5th toes respectively.  We then went back to the 2nd MTP joint, plantar flexed this and with a sagittal saw blade, we then performed a Weil shortening osteotomy in the plane of plantar aspect of the foot.  The head was then translated about 3 mm proximally and fixed with a Weil metatarsal osteotomy screw after a pilot hole was made.  We then trimmed off the redundant bone dorsally.  We then did the same exact procedure for the 3rd toe as well.  We then placed a 0.045 K-wire antegrade through middle and distal phalanx of the 2nd toe, reduced the PIP joint, fired this retrograde across the MTP joint with the toe held in its desired position.  Again this was done for the 2nd, 3rd, 4th, and 5th toes respectively.  We then obtained stress x-rays, AP and lateral planes.  This showed no gross motion, fixation, proposition, and excellent alignment as well.  Symmetry restored to metatarsal heads.  Of note, the great toe had a contracture and slight extension.  We will see how this will do first.  Over time this may need to be addressed down the road as well.  The wounds were copiously irrigated with normal saline.  Tourniquet deflated.  Hemostasis was obtained.  There was no pulsatile bleeding.  EDB, EDL tendon transfer was then performed with a 4-0 PDS stitch.  This had outstanding repair.  Again this was done for the 2nd, 3rd, 4th, and 5th toes respectively.  K-wires were bent, cut, capped.  Skin relieving incisions were made on either side of K-wire. Again there was no pulsatile bleeding.  Toes pinked up nicely.  The skin was closed with 4-0 nylon stitch over all wounds.  Sterile dressing was applied.  CAM walker boot was applied.  The patient was stable to the PR.     Leonides Grills, M.D.     PB/MEDQ  D:  09/26/2012  T:  09/26/2012  Job:  [id number]

## 2013-02-16 ENCOUNTER — Encounter (HOSPITAL_COMMUNITY): Payer: Self-pay | Admitting: *Deleted

## 2013-02-16 ENCOUNTER — Emergency Department (HOSPITAL_COMMUNITY)
Admission: EM | Admit: 2013-02-16 | Discharge: 2013-02-16 | Disposition: A | Discharge status: Home / Self Care | Payer: BC Managed Care – PPO | Attending: Emergency Medicine | Admitting: Emergency Medicine

## 2013-02-16 DIAGNOSIS — E785 Hyperlipidemia, unspecified: Secondary | ICD-10-CM | POA: Insufficient documentation

## 2013-02-16 DIAGNOSIS — M129 Arthropathy, unspecified: Secondary | ICD-10-CM | POA: Insufficient documentation

## 2013-02-16 DIAGNOSIS — R011 Cardiac murmur, unspecified: Secondary | ICD-10-CM | POA: Insufficient documentation

## 2013-02-16 DIAGNOSIS — R609 Edema, unspecified: Secondary | ICD-10-CM | POA: Insufficient documentation

## 2013-02-16 DIAGNOSIS — Z9889 Other specified postprocedural states: Secondary | ICD-10-CM | POA: Insufficient documentation

## 2013-02-16 DIAGNOSIS — E876 Hypokalemia: Secondary | ICD-10-CM | POA: Insufficient documentation

## 2013-02-16 DIAGNOSIS — R600 Localized edema: Secondary | ICD-10-CM

## 2013-02-16 DIAGNOSIS — I10 Essential (primary) hypertension: Secondary | ICD-10-CM | POA: Insufficient documentation

## 2013-02-16 DIAGNOSIS — Z79899 Other long term (current) drug therapy: Secondary | ICD-10-CM | POA: Insufficient documentation

## 2013-02-16 LAB — CBC WITH DIFFERENTIAL/PLATELET
Basophils Absolute: 0 10*3/uL (ref 0.0–0.1)
Basophils Relative: 0 % (ref 0–1)
Eosinophils Absolute: 0.5 10*3/uL (ref 0.0–0.7)
Eosinophils Relative: 7 % — ABNORMAL HIGH (ref 0–5)
HCT: 32 % — ABNORMAL LOW (ref 36.0–46.0)
Hemoglobin: 10.5 g/dL — ABNORMAL LOW (ref 12.0–15.0)
Lymphocytes Relative: 35 % (ref 12–46)
Lymphs Abs: 2.5 10*3/uL (ref 0.7–4.0)
MCH: 28.2 pg (ref 26.0–34.0)
MCHC: 32.8 g/dL (ref 30.0–36.0)
MCV: 86 fL (ref 78.0–100.0)
Monocytes Absolute: 0.4 10*3/uL (ref 0.1–1.0)
Monocytes Relative: 6 % (ref 3–12)
Neutro Abs: 3.7 10*3/uL (ref 1.7–7.7)
Neutrophils Relative %: 52 % (ref 43–77)
Platelets: 402 10*3/uL — ABNORMAL HIGH (ref 150–400)
RBC: 3.72 MIL/uL — ABNORMAL LOW (ref 3.87–5.11)
RDW: 13.1 % (ref 11.5–15.5)
WBC: 7.1 10*3/uL (ref 4.0–10.5)

## 2013-02-16 LAB — COMPREHENSIVE METABOLIC PANEL
ALT: 14 U/L (ref 0–35)
AST: 18 U/L (ref 0–37)
Albumin: 3.6 g/dL (ref 3.5–5.2)
Alkaline Phosphatase: 99 U/L (ref 39–117)
BUN: 9 mg/dL (ref 6–23)
CO2: 30 mEq/L (ref 19–32)
Calcium: 9 mg/dL (ref 8.4–10.5)
Chloride: 100 mEq/L (ref 96–112)
Creatinine, Ser: 0.76 mg/dL (ref 0.50–1.10)
GFR calc Af Amer: >90 mL/min (ref >90)
GFR calc non Af Amer: 90 mL/min — ABNORMAL LOW (ref >90)
Glucose, Bld: 123 mg/dL — ABNORMAL HIGH (ref 70–99)
Potassium: 3.2 mEq/L — ABNORMAL LOW (ref 3.5–5.1)
Sodium: 138 mEq/L (ref 135–145)
Total Bilirubin: 0.7 mg/dL (ref 0.3–1.2)
Total Protein: 7.1 g/dL (ref 6.0–8.3)

## 2013-02-16 LAB — PRO B NATRIURETIC PEPTIDE: Pro B Natriuretic peptide (BNP): 161.3 pg/mL — ABNORMAL HIGH (ref 0–125)

## 2013-02-16 MED ORDER — ENOXAPARIN SODIUM 100 MG/ML ~~LOC~~ SOLN
40.0000 mg | Freq: Once | SUBCUTANEOUS | Status: AC
  Administered 2013-02-16: 40 mg via SUBCUTANEOUS
  Filled 2013-02-16: qty 1

## 2013-02-16 NOTE — ED Notes (Signed)
Pt presents to er with c/o bilateral lower leg swelling, redness and pain, states that she has been dealing swelling "for awhile" on left leg, started to experience swelling in both legs this week, did take a fluid pill for the swelling which helped decrease the swelling but now the swelling is starting to return, now pt is experiencing redness and pain to both legs.

## 2013-02-16 NOTE — ED Notes (Signed)
Patient with no complaints at this time. Respirations even and unlabored. Skin warm/dry. Discharge instructions reviewed with patient at this time. Patient given opportunity to voice concerns/ask questions. Patient discharged at this time and left Emergency Department with steady gait.   

## 2013-02-16 NOTE — ED Provider Notes (Signed)
History    This chart was scribed for Brandi Munch, MD, by Frederik Pear, ED scribe. The patient was seen in room APA12/APA12 and the patient's care was started at 1303.    CSN: [id number]  Arrival date & time 02/16/13  1231   First MD Initiated Contact with Patient 02/16/13 1303      Chief Complaint  Patient presents with  . Leg Swelling    The history is provided by the patient, the spouse and medical records. No language interpreter was used.    Brandi Russell is a 62 y.o. female with a h/o of bilateral bunionectomies, hammertoe reconstruction, and hypertension who presents to the Emergency Department complaining of sudden onset, constant, gradually worsening bilateral ankle swelling that is worse on the right and became more pronounced yesterday. She also complain of intermittent (occasional) CP that has been present for an extended period of time, but denies CP in ED (or in t he recent past). She denies abdominal pain, nausea, emesis, diarrhea, or rash. She denies any recent travel or immobilization. She reports that she took a fluid pill last night that provided temporary relief. She has a h/o of a cardiac catheterization in 2007.  PCP is Dr. Charlynne Cousins in [place].   Past Medical History  Diagnosis Date  . Hypertension   . Heart murmur   . Hyperlipemia   . Contact lens/glasses fitting     wears fglasses or contacts  . Arthritis   . Headache     Past Surgical History  Procedure Laterality Date  . Bunionectomy      both feet  . Hammertoe reconstruction with weil osteotomy  2006    rt foot  . Colonoscopy    . Cardiac catheterization  2007    [place]  . Hammertoe reconstruction with weil osteotomy and gastroc slide  09/26/2012    Procedure: HAMMERTOE RECONSTRUCTION WITH WEIL OSTEOTOMY AND GASTROC SLIDE;  Surgeon: Sherri Rad, MD;  Location: [facility];  Service: Orthopedics;  Laterality: Left;  LEFT GASTROC SLIDE, 2ND/3RD WEIL METATARSAL SHORTING  OSTEOTOMIES, 2ND through 5th TOES MTP JOINT, DORSAL CAPSULOTOMY, HAMMER TOE RECONSTRUCTION,  FDL TO PROXIMAL PHALANX TENDON TRANSFER    No family history on file.  History  Substance Use Topics  . Smoking status: Never Smoker   . Smokeless tobacco: Not on file  . Alcohol Use: No    OB History   Grav Para Term Preterm Abortions TAB SAB Ect Mult Living                  Review of Systems A complete 10 system review of systems was obtained and all systems are negative except as noted in the HPI and PMH.  Allergies  Review of patient's allergies indicates no known allergies.  Home Medications   Current Outpatient Rx  Name  Route  Sig  Dispense  Refill  . methocarbamol (ROBAXIN) 500 MG tablet   Oral   Take 1 tablet (500 mg total) by mouth 3 (three) times daily.   40 tablet   1   . metoprolol tartrate (LOPRESSOR) 25 MG tablet   Oral   Take 25 mg by mouth 2 (two) times daily.         . Multiple Vitamins-Minerals (MULTIVITAMIN WITH MINERALS) tablet   Oral   Take 1 tablet by mouth daily.         Marland Kitchen oxyCODONE-acetaminophen (ROXICET) 5-325 MG per tablet   Oral   Take 1-2 tablets by mouth  every 4 (four) hours as needed for pain.   40 tablet   0   . simvastatin (ZOCOR) 20 MG tablet   Oral   Take 20 mg by mouth every evening.         . topiramate (TOPAMAX) 25 MG capsule   Oral   Take 25 mg by mouth 2 (two) times daily. Takes 2         . vitamin C (ASCORBIC ACID) 500 MG tablet   Oral   Take 1 tablet (500 mg total) by mouth daily.   90 tablet   0     BP 143/77  Pulse 87  Temp(Src) 98.7 F (37.1 C) (Oral)  Resp 18  Ht 5\' 3"  (1.6 m)  Wt 180 lb (81.647 kg)  BMI 31.89 kg/m2  SpO2 99%  Physical Exam  Nursing note and vitals reviewed. Constitutional: She is oriented to person, place, and time. She appears well-developed and well-nourished. No distress.  HENT:  Head: Normocephalic and atraumatic.  Eyes: Conjunctivae and EOM are normal.  Cardiovascular:  Normal rate, regular rhythm and normal heart sounds.   No murmur heard. Pulses are intact.  Pulmonary/Chest: Effort normal and breath sounds normal. No stridor. No respiratory distress. She has no wheezes. She has no rales. She exhibits no tenderness.  Abdominal: She exhibits no distension.  Musculoskeletal: She exhibits edema and tenderness.  Bilateral lower leg edema. Normal ROM of the ankles.   Positive Stemmer's Sign   Neurological: She is alert and oriented to person, place, and time. No cranial nerve deficit.  Neurovascularly intact.   Skin: Skin is warm and dry.  Psychiatric: She has a normal mood and affect.    ED Course  Procedures (including critical care time)   COORDINATION OF CARE:  13:24- Discussed planned course of treatment with the patient, including blood work, and EKG, who is agreeable at this time.  15:25- Recheck- Discussed returning to the ED for an UA, taking 1 day of Lovenox,and following up with her PCP to which she agreed.   Date: 02/16/2013  Rate: 74  Rhythm: normal sinus rhythm  QRS Axis: normal  Intervals: normal  ST/T Wave abnormalities: nonspecific ST changes  Conduction Disutrbances:none  Narrative Interpretation:   Old EKG Reviewed: none available  Results for orders placed during the hospital encounter of 02/16/13  CBC WITH DIFFERENTIAL      Result Value Range   WBC 7.1  4.0 - 10.5 K/uL   RBC 3.72 (*) 3.87 - 5.11 MIL/uL   Hemoglobin 10.5 (*) 12.0 - 15.0 g/dL   HCT 16.1 (*) 09.6 - 04.5 %   MCV 86.0  78.0 - 100.0 fL   MCH 28.2  26.0 - 34.0 pg   MCHC 32.8  30.0 - 36.0 g/dL   RDW 40.9  81.1 - 91.4 %   Platelets 402 (*) 150 - 400 K/uL   Neutrophils Relative 52  43 - 77 %   Neutro Abs 3.7  1.7 - 7.7 K/uL   Lymphocytes Relative 35  12 - 46 %   Lymphs Abs 2.5  0.7 - 4.0 K/uL   Monocytes Relative 6  3 - 12 %   Monocytes Absolute 0.4  0.1 - 1.0 K/uL   Eosinophils Relative 7 (*) 0 - 5 %   Eosinophils Absolute 0.5  0.0 - 0.7 K/uL    Basophils Relative 0  0 - 1 %   Basophils Absolute 0.0  0.0 - 0.1 K/uL  COMPREHENSIVE METABOLIC PANEL  Result Value Range   Sodium 138  135 - 145 mEq/L   Potassium 3.2 (*) 3.5 - 5.1 mEq/L   Chloride 100  96 - 112 mEq/L   CO2 30  19 - 32 mEq/L   Glucose, Bld 123 (*) 70 - 99 mg/dL   BUN 9  6 - 23 mg/dL   Creatinine, Ser 1.61  0.50 - 1.10 mg/dL   Calcium 9.0  8.4 - 09.6 mg/dL   Total Protein 7.1  6.0 - 8.3 g/dL   Albumin 3.6  3.5 - 5.2 g/dL   AST 18  0 - 37 U/L   ALT 14  0 - 35 U/L   Alkaline Phosphatase 99  39 - 117 U/L   Total Bilirubin 0.7  0.3 - 1.2 mg/dL   GFR calc non Af Amer 90 (*) >90 mL/min   GFR calc Af Amer >90  >90 mL/min  PRO B NATRIURETIC PEPTIDE      Result Value Range   Pro B Natriuretic peptide (BNP) 161.3 (*) 0 - 125 pg/mL      Labs Reviewed - No data to display No results found.   No diagnosis found.  O2- 99%ra, normal Cardiac: 81sr, normal    MDM  I personally performed the services described in this documentation, which was scribed in my presence. The recorded information has been reviewed and is accurate.   This pleasant female with ongoing lower extremity edema, now worse over the past day presents with this concern.  The patient has other occasional issues, or progression of lower extremity edema was evaluated with labs ECG, which were largely unremarkable aside from a minimally elevated BNP, and mild anemia.  Patient also is mild hypokalemia.  The patient was treated empirically for DVT, though she has minimal risk profile for this entity.  This is her primary concern.  She will return tomorrow for ultrasound. The patient's presentation is most consistent with either venous or lymphatic insufficiency, though early CHF, or dysregulation of proteins are considered as well.  She is stable for discharge with next followup, and next week follow up with her primary care physician.  We discussed return precautions, the need to follow up with these  appointments.    Brandi Munch, MD 02/16/13 [phone]

## 2013-02-16 NOTE — ED Notes (Signed)
Cannot visualize any breaks in skin on feet or legs.  Patient took Furosemide (Rx by Dr. Charlynne Cousins PMD) this week and lost 9 lbs. In 24 hour period.  Hx of cardiac murmur, but does not know what valve is problem and has never seen cardiologist.  She is unsure shy Dr. Charlynne Cousins originally prescribed Lasix, but states she only takes it when her legs swell.  She has never had doppler studies of LE.

## 2013-02-17 ENCOUNTER — Ambulatory Visit (HOSPITAL_COMMUNITY)
Admit: 2013-02-17 | Discharge: 2013-02-17 | Disposition: A | Discharge status: Home / Self Care | Payer: BC Managed Care – PPO | Source: Ambulatory Visit | Attending: Emergency Medicine | Admitting: Emergency Medicine

## 2013-02-17 ENCOUNTER — Ambulatory Visit (HOSPITAL_COMMUNITY): Admission: RE | Admit: 2013-02-17 | Payer: BC Managed Care – PPO | Source: Ambulatory Visit

## 2013-02-17 ENCOUNTER — Ambulatory Visit (HOSPITAL_COMMUNITY): Payer: BC Managed Care – PPO

## 2013-02-17 DIAGNOSIS — M7989 Other specified soft tissue disorders: Secondary | ICD-10-CM | POA: Insufficient documentation

## 2013-02-17 DIAGNOSIS — M79609 Pain in unspecified limb: Secondary | ICD-10-CM | POA: Insufficient documentation

## 2015-09-15 ENCOUNTER — Telehealth: Payer: Self-pay | Admitting: Cardiology

## 2015-09-15 NOTE — Telephone Encounter (Signed)
[facility] for appointment on 09/29/15 with Dr Jens Somrenshaw.  [facility] Dr Ludwig Clarksrenshaw's schedule on 09/29/15. lp

## 2015-09-25 NOTE — Progress Notes (Signed)
HPI: 64 year old female for evaluation of chest pain. Patient apparently had a cardiac catheterization in [place] in 2007. This was performed secondary to chest pain. Apparently he did not show obstructive coronary disease but records are not available. Patient is presently being evaluated for anemia and the cause is unknown. She describes dyspnea on exertion but no orthopnea, PND or syncope. She occasionally has mild pedal edema. This improves with keeping her feet elevated. She does have chest pain. It is in the substernal area and described as a sharp pain. No radiation. No associated symptoms. Not pleuritic, positional, exertional. Some improvement with antacids.  Current Outpatient Prescriptions  Medication Sig Dispense Refill  . aspirin 325 MG tablet Take 325 mg by mouth daily.    Marland Kitchen atorvastatin (LIPITOR) 20 MG tablet Take 1 tablet by mouth daily.  0  . Calcium Carbonate-Vitamin D (CALCIUM + D PO) Take 1 tablet by mouth daily. Unknown strength    . Cholecalciferol (VITAMIN D PO) Take 1 tablet by mouth daily. Unknown strength    . diclofenac sodium (VOLTAREN) 1 % GEL Apply 1 application topically daily as needed.  0  . furosemide (LASIX) 40 MG tablet Take 40 mg by mouth daily.    Marland Kitchen gabapentin (NEURONTIN) 300 MG capsule Take 1 capsule by mouth 2 (two) times daily.  1  . metoprolol tartrate (LOPRESSOR) 25 MG tablet Take 25 mg by mouth daily.     . Multiple Vitamins-Minerals (HAIR VITAMINS PO) Take 1 tablet by mouth daily.    . Multiple Vitamins-Minerals (MULTIVITAMIN WITH MINERALS) tablet Take 1 tablet by mouth daily.    Marland Kitchen omeprazole (PRILOSEC) 20 MG capsule Take 1 capsule by mouth daily.  0  . ondansetron (ZOFRAN) 8 MG tablet Take 1 tablet by mouth every 8 (eight) hours as needed.  0  . oxybutynin (DITROPAN XL) 15 MG 24 hr tablet Take 15 mg by mouth daily.    . polyethylene glycol powder (GLYCOLAX/MIRALAX) powder Take 17 g by mouth daily.  1  . potassium chloride (K-DUR) 10 MEQ tablet  Take 10 mEq by mouth daily.    Marland Kitchen SAXENDA 18 MG/3ML SOPN Inject 2.4 Units into the skin daily.  0  . topiramate (TOPAMAX) 25 MG tablet Take 50 mg by mouth 2 (two) times daily.    . traMADol (ULTRAM) 50 MG tablet Take 1 tablet by mouth daily.  0  . vitamin C (ASCORBIC ACID) 500 MG tablet Take 1 tablet (500 mg total) by mouth daily. 90 tablet 0   No current facility-administered medications for this visit.    No Known Allergies   Past Medical History  Diagnosis Date  . Hypertension   . Heart murmur   . Hyperlipemia   . Contact lens/glasses fitting     wears glasses or contacts  . Arthritis   . Headache(784.0)   . Anemia     Past Surgical History  Procedure Laterality Date  . Bunionectomy      both feet  . Hammertoe reconstruction with weil osteotomy  2006    rt foot  . Colonoscopy    . Cardiac catheterization  2007    [place]  . Hammertoe reconstruction with weil osteotomy and gastroc slide  09/26/2012    Procedure: HAMMERTOE RECONSTRUCTION WITH WEIL OSTEOTOMY AND GASTROC SLIDE;  Surgeon: Sherri Rad, MD;  Location: [facility];  Service: Orthopedics;  Laterality: Left;  LEFT GASTROC SLIDE, 2ND/3RD WEIL METATARSAL SHORTING OSTEOTOMIES, 2ND through 5th TOES MTP JOINT, DORSAL  CAPSULOTOMY, HAMMER TOE RECONSTRUCTION,  FDL TO PROXIMAL PHALANX TENDON TRANSFER    Social History   Social History  . Marital Status: Married    Spouse Name: N/A  . Number of Children: 2  . Years of Education: N/A   Occupational History  . Not on file.   Social History Main Topics  . Smoking status: Never Smoker   . Smokeless tobacco: Not on file  . Alcohol Use: No  . Drug Use: No  . Sexual Activity: Not on file   Other Topics Concern  . Not on file   Social History Narrative    Family History  Problem Relation Age of Onset  . Heart disease Mother     MI  . CAD Father   . CAD Brother   . CAD Brother     ROS: Pain in feet but no fevers or chills, productive cough,  hemoptysis, dysphasia, odynophagia, melena, hematochezia, dysuria, hematuria, rash, seizure activity, orthopnea, PND, pedal edema, claudication. Remaining systems are negative.  Physical Exam:   Blood pressure 130/72, pulse 69, height 5\' 3"  (1.6 m), weight 164 lb 3.2 oz (74.481 kg).  General:  Well developed/well nourished in NAD Skin warm/dry Patient not depressed No peripheral clubbing Back-normal HEENT-normal/normal eyelids Neck supple/normal carotid upstroke bilaterally; no bruits; no JVD; no thyromegaly chest - CTA/ normal expansion CV - RRR/normal S1 and S2; no murmurs, rubs or gallops;  PMI nondisplaced Abdomen -NT/ND, no HSM, no mass, + bowel sounds, no bruit 2+ femoral pulses, no bruits Ext-no edema, chords, 2+ DP Neuro-grossly nonfocal  ECG Sinus rhythm at a rate of 69. No ST changes.

## 2015-09-29 ENCOUNTER — Encounter: Payer: Self-pay | Admitting: Cardiology

## 2015-09-29 ENCOUNTER — Ambulatory Visit (INDEPENDENT_AMBULATORY_CARE_PROVIDER_SITE_OTHER): Payer: Medicare Other | Admitting: Cardiology

## 2015-09-29 ENCOUNTER — Encounter: Payer: Self-pay | Admitting: *Deleted

## 2015-09-29 VITALS — BP 130/72 | HR 69 | Ht 63.0 in | Wt 164.2 lb

## 2015-09-29 DIAGNOSIS — I10 Essential (primary) hypertension: Secondary | ICD-10-CM | POA: Insufficient documentation

## 2015-09-29 DIAGNOSIS — R0600 Dyspnea, unspecified: Secondary | ICD-10-CM | POA: Insufficient documentation

## 2015-09-29 DIAGNOSIS — D649 Anemia, unspecified: Secondary | ICD-10-CM | POA: Insufficient documentation

## 2015-09-29 DIAGNOSIS — E785 Hyperlipidemia, unspecified: Secondary | ICD-10-CM

## 2015-09-29 DIAGNOSIS — R072 Precordial pain: Secondary | ICD-10-CM

## 2015-09-29 DIAGNOSIS — R079 Chest pain, unspecified: Secondary | ICD-10-CM | POA: Insufficient documentation

## 2015-09-29 NOTE — Assessment & Plan Note (Signed)
Plan echocardiogram to assess LV function. 

## 2015-09-29 NOTE — Assessment & Plan Note (Signed)
Evaluation per hematology.Anemia could certainly be contributing to her dyspnea.

## 2015-09-29 NOTE — Patient Instructions (Signed)
Medication Instructions:   NO CHANGE  Testing/Procedures:  Your physician has requested that you have a lexiscan myoview. For further information please visit [email or web address]. Please follow instruction sheet, as given.   Your physician has requested that you have an echocardiogram. Echocardiography is a painless test that uses sound waves to create images of your heart. It provides your doctor with information about the size and shape of your heart and how well your heart's chambers and valves are working. This procedure takes approximately one hour. There are no restrictions for this procedure.    Follow-Up:  Your physician recommends that you schedule a follow-up appointment in: AS NEEDED   If you need a refill on your cardiac medications before your next appointment, please call your pharmacy.

## 2015-09-29 NOTE — Assessment & Plan Note (Signed)
Continue statin. 

## 2015-09-29 NOTE — Assessment & Plan Note (Signed)
Symptoms atypical. Strong family history of coronary disease. She does have pain in her feet and cannot ambulate well. Plan Lexiscan nuclear study for risk stratification.

## 2015-09-29 NOTE — Assessment & Plan Note (Signed)
Blood pressure controlled. Continue present medications. 

## 2015-10-15 ENCOUNTER — Telehealth (HOSPITAL_COMMUNITY): Payer: Self-pay

## 2015-10-15 NOTE — Telephone Encounter (Signed)
Encounter complete. 

## 2015-10-21 ENCOUNTER — Ambulatory Visit (HOSPITAL_COMMUNITY)
Admission: RE | Admit: 2015-10-21 | Discharge: 2015-10-21 | Disposition: A | Discharge status: Home / Self Care | Payer: Medicare Other | Source: Ambulatory Visit | Attending: Cardiology | Admitting: Cardiology

## 2015-10-21 ENCOUNTER — Ambulatory Visit (HOSPITAL_BASED_OUTPATIENT_CLINIC_OR_DEPARTMENT_OTHER): Payer: Medicare Other

## 2015-10-21 ENCOUNTER — Other Ambulatory Visit: Payer: Self-pay

## 2015-10-21 DIAGNOSIS — R002 Palpitations: Secondary | ICD-10-CM | POA: Insufficient documentation

## 2015-10-21 DIAGNOSIS — I517 Cardiomegaly: Secondary | ICD-10-CM | POA: Insufficient documentation

## 2015-10-21 DIAGNOSIS — I10 Essential (primary) hypertension: Secondary | ICD-10-CM | POA: Diagnosis not present

## 2015-10-21 DIAGNOSIS — R0609 Other forms of dyspnea: Secondary | ICD-10-CM | POA: Insufficient documentation

## 2015-10-21 DIAGNOSIS — R5383 Other fatigue: Secondary | ICD-10-CM | POA: Diagnosis not present

## 2015-10-21 DIAGNOSIS — R0602 Shortness of breath: Secondary | ICD-10-CM | POA: Diagnosis not present

## 2015-10-21 DIAGNOSIS — Z8249 Family history of ischemic heart disease and other diseases of the circulatory system: Secondary | ICD-10-CM | POA: Insufficient documentation

## 2015-10-21 DIAGNOSIS — I071 Rheumatic tricuspid insufficiency: Secondary | ICD-10-CM | POA: Insufficient documentation

## 2015-10-21 DIAGNOSIS — R42 Dizziness and giddiness: Secondary | ICD-10-CM | POA: Diagnosis not present

## 2015-10-21 DIAGNOSIS — R079 Chest pain, unspecified: Secondary | ICD-10-CM

## 2015-10-21 LAB — MYOCARDIAL PERFUSION IMAGING
LV dias vol: 59 mL
LV sys vol: 19 mL
Peak HR: 105 {beats}/min
Rest HR: 75 {beats}/min
SDS: 1
SRS: 0
SSS: 1
TID: 1.16

## 2015-10-21 MED ORDER — TECHNETIUM TC 99M SESTAMIBI GENERIC - CARDIOLITE
31.0000 | Freq: Once | INTRAVENOUS | Status: AC | PRN
  Administered 2015-10-21: 31 via INTRAVENOUS

## 2015-10-21 MED ORDER — TECHNETIUM TC 99M SESTAMIBI GENERIC - CARDIOLITE
10.4000 | Freq: Once | INTRAVENOUS | Status: AC | PRN
  Administered 2015-10-21: 10.4 via INTRAVENOUS

## 2015-10-21 MED ORDER — REGADENOSON 0.4 MG/5ML IV SOLN
0.4000 mg | Freq: Once | INTRAVENOUS | Status: AC
  Administered 2015-10-21: 0.4 mg via INTRAVENOUS

## 2016-09-13 ENCOUNTER — Observation Stay (HOSPITAL_COMMUNITY)
Admission: AD | Admit: 2016-09-13 | Discharge: 2016-09-14 | Disposition: A | Discharge status: Home / Self Care | Payer: Medicare HMO | Source: Other Acute Inpatient Hospital | Attending: Cardiology | Admitting: Cardiology

## 2016-09-13 DIAGNOSIS — K219 Gastro-esophageal reflux disease without esophagitis: Secondary | ICD-10-CM | POA: Insufficient documentation

## 2016-09-13 DIAGNOSIS — Z79899 Other long term (current) drug therapy: Secondary | ICD-10-CM | POA: Diagnosis not present

## 2016-09-13 DIAGNOSIS — I200 Unstable angina: Secondary | ICD-10-CM | POA: Diagnosis present

## 2016-09-13 DIAGNOSIS — I10 Essential (primary) hypertension: Secondary | ICD-10-CM | POA: Insufficient documentation

## 2016-09-13 DIAGNOSIS — Z8249 Family history of ischemic heart disease and other diseases of the circulatory system: Secondary | ICD-10-CM | POA: Insufficient documentation

## 2016-09-13 DIAGNOSIS — R011 Cardiac murmur, unspecified: Secondary | ICD-10-CM | POA: Insufficient documentation

## 2016-09-13 DIAGNOSIS — R072 Precordial pain: Principal | ICD-10-CM | POA: Insufficient documentation

## 2016-09-13 DIAGNOSIS — I2510 Atherosclerotic heart disease of native coronary artery without angina pectoris: Secondary | ICD-10-CM | POA: Insufficient documentation

## 2016-09-13 DIAGNOSIS — Z7982 Long term (current) use of aspirin: Secondary | ICD-10-CM | POA: Insufficient documentation

## 2016-09-13 DIAGNOSIS — D649 Anemia, unspecified: Secondary | ICD-10-CM | POA: Diagnosis not present

## 2016-09-13 DIAGNOSIS — E785 Hyperlipidemia, unspecified: Secondary | ICD-10-CM | POA: Diagnosis not present

## 2016-09-13 DIAGNOSIS — R9439 Abnormal result of other cardiovascular function study: Secondary | ICD-10-CM | POA: Diagnosis not present

## 2016-09-13 DIAGNOSIS — R079 Chest pain, unspecified: Secondary | ICD-10-CM

## 2016-09-13 HISTORY — DX: Chest pain, unspecified: R07.9

## 2016-09-13 MED ORDER — ACETAMINOPHEN 325 MG PO TABS
650.0000 mg | ORAL_TABLET | ORAL | Status: DC | PRN
Start: 2016-09-13 — End: 2016-09-14

## 2016-09-13 MED ORDER — NITROGLYCERIN 0.4 MG SL SUBL
0.4000 mg | SUBLINGUAL_TABLET | SUBLINGUAL | Status: DC | PRN
Start: 2016-09-13 — End: 2016-09-14

## 2016-09-13 MED ORDER — SODIUM CHLORIDE 0.9 % IV SOLN: 250.0000 mL | INTRAVENOUS | Status: DC | PRN

## 2016-09-13 MED ORDER — SODIUM CHLORIDE 0.9 % IV SOLN
INTRAVENOUS | Status: DC
  Administered 2016-09-14: via INTRAVENOUS

## 2016-09-13 MED ORDER — ASPIRIN 300 MG RE SUPP: 300.0000 mg | RECTAL | Status: AC

## 2016-09-13 MED ORDER — METOPROLOL TARTRATE 25 MG PO TABS
25.0000 mg | ORAL_TABLET | Freq: Every day | ORAL | Status: DC
  Administered 2016-09-14: 25 mg via ORAL
  Filled 2016-09-13: qty 1

## 2016-09-13 MED ORDER — CLOPIDOGREL BISULFATE 75 MG PO TABS
300.0000 mg | ORAL_TABLET | Freq: Once | ORAL | Status: AC
  Administered 2016-09-13: 300 mg via ORAL
  Filled 2016-09-13: qty 4

## 2016-09-13 MED ORDER — TOPIRAMATE 100 MG PO TABS
50.0000 mg | ORAL_TABLET | Freq: Two times a day (BID) | ORAL | Status: DC
  Administered 2016-09-14 (×2): 50 mg via ORAL
  Filled 2016-09-13 (×2): qty 1

## 2016-09-13 MED ORDER — ATORVASTATIN CALCIUM 40 MG PO TABS
40.0000 mg | ORAL_TABLET | Freq: Every day | ORAL | Status: DC
  Administered 2016-09-14: 40 mg via ORAL
  Filled 2016-09-13: qty 1

## 2016-09-13 MED ORDER — ONDANSETRON HCL 4 MG/2ML IJ SOLN: 4.0000 mg | Freq: Four times a day (QID) | INTRAMUSCULAR | Status: DC | PRN

## 2016-09-13 MED ORDER — ASPIRIN 81 MG PO CHEW
324.0000 mg | CHEWABLE_TABLET | ORAL | Status: AC
  Administered 2016-09-13: 81 mg via ORAL
  Filled 2016-09-13: qty 4

## 2016-09-13 MED ORDER — HEPARIN BOLUS VIA INFUSION
4000.0000 [IU] | Freq: Once | INTRAVENOUS | Status: AC
  Administered 2016-09-13: 4000 [IU] via INTRAVENOUS
  Filled 2016-09-13: qty 4000

## 2016-09-13 MED ORDER — FERROUS SULFATE 325 (65 FE) MG PO TABS
325.0000 mg | ORAL_TABLET | Freq: Two times a day (BID) | ORAL | Status: DC
  Administered 2016-09-14 (×2): 325 mg via ORAL
  Filled 2016-09-13 (×2): qty 1

## 2016-09-13 MED ORDER — ASPIRIN EC 81 MG PO TBEC
81.0000 mg | DELAYED_RELEASE_TABLET | Freq: Every day | ORAL | Status: DC
  Administered 2016-09-14: 81 mg via ORAL
  Filled 2016-09-13: qty 1

## 2016-09-13 MED ORDER — NITROGLYCERIN IN D5W 200-5 MCG/ML-% IV SOLN
5.0000 ug/min | INTRAVENOUS | Status: DC
  Administered 2016-09-14: 5 ug/min via INTRAVENOUS

## 2016-09-13 MED ORDER — PANTOPRAZOLE SODIUM 40 MG PO TBEC
40.0000 mg | DELAYED_RELEASE_TABLET | Freq: Every day | ORAL | Status: DC
  Administered 2016-09-14: 40 mg via ORAL
  Filled 2016-09-13: qty 1

## 2016-09-13 MED ORDER — SODIUM CHLORIDE 0.9% FLUSH: 3.0000 mL | INTRAVENOUS | Status: DC | PRN

## 2016-09-13 MED ORDER — SODIUM CHLORIDE 0.9 % IV SOLN
INTRAVENOUS | Status: DC
  Administered 2016-09-14: 250 mL via INTRAVENOUS

## 2016-09-13 MED ORDER — SODIUM CHLORIDE 0.9% FLUSH
3.0000 mL | Freq: Two times a day (BID) | INTRAVENOUS | Status: DC
  Administered 2016-09-14: 3 mL via INTRAVENOUS

## 2016-09-13 MED ORDER — HEPARIN (PORCINE) IN NACL 100-0.45 UNIT/ML-% IJ SOLN
900.0000 [IU]/h | INTRAMUSCULAR | Status: DC
  Administered 2016-09-13: 1000 [IU]/h via INTRAVENOUS
  Filled 2016-09-13: qty 250

## 2016-09-13 MED ORDER — POLYETHYLENE GLYCOL 3350 17 G PO PACK
17.0000 g | PACK | Freq: Every day | ORAL | Status: DC
  Administered 2016-09-14: 17 g via ORAL
  Filled 2016-09-13: qty 1

## 2016-09-13 NOTE — H&P (Signed)
Brandi Russell is an 65 y.o. female.   Chief Complaint: Chest pain HPI: Patient is 65 year old female with past medical history significant for hypertension, hyperlipidemia, strong family history of coronary artery disease mother father and brother had PCI's in their 8s and 58s, GERD, chronic anemia had extensive GI workup including bone marrow biopsy in the past, degenerative joint disease, history of migraine headaches, was transferred from [facility]. Patient complained of retrosternal chest pain which started around 6 PM yesterday while in kitchen described as sharp and heaviness grade 10 over 10 radiating to left shoulder and back without associated nausea vomiting or diaphoresis. Denies shortness of breath states chest pain was waxing and waning finally decided to go to the ED MI was ruled out by serial enzymes and EKG and subsequently patient underwent stress Myoview which showed anteroapical wall ischemia with EF of 63%. Patient was transferred to [facility] for further management. Patient presently denies any chest pain on IV nitroglycerin. Patient denies any history of exertional chest pain but gives history of exertional dyspnea associated with feeling weak tired and fatigue.   Past Medical History:  Diagnosis Date  . Anemia   . Arthritis   . Contact lens/glasses fitting    wears glasses or contacts  . Headache(784.0)   . Heart murmur   . Hyperlipemia   . Hypertension     Past Surgical History:  Procedure Laterality Date  . BUNIONECTOMY     both feet  . CARDIAC CATHETERIZATION  2007   [place]  . COLONOSCOPY    . HAMMERTOE RECONSTRUCTION WITH WEIL OSTEOTOMY  2006   rt foot  . HAMMERTOE RECONSTRUCTION WITH WEIL OSTEOTOMY AND GASTROC SLIDE  09/26/2012   Procedure: HAMMERTOE RECONSTRUCTION WITH WEIL OSTEOTOMY AND GASTROC SLIDE;  Surgeon: Colin Rhein, MD;  Location: [place];  Service: Orthopedics;  Laterality: Left;  LEFT GASTROC SLIDE, 2ND/3RD  WEIL METATARSAL SHORTING OSTEOTOMIES, 2ND through 5th TOES MTP JOINT, DORSAL CAPSULOTOMY, HAMMER TOE RECONSTRUCTION,  FDL TO PROXIMAL PHALANX TENDON TRANSFER    Family History  Problem Relation Age of Onset  . Heart disease Mother     MI  . CAD Father   . CAD Brother   . CAD Brother    Social History:  reports that she has never smoked. She does not have any smokeless tobacco history on file. She reports that she does not drink alcohol or use drugs.  Allergies: No Known Allergies  Medications Prior to Admission  Medication Sig Dispense Refill  . aspirin 325 MG tablet Take 325 mg by mouth daily.    Marland Kitchen atorvastatin (LIPITOR) 20 MG tablet Take 1 tablet by mouth daily.  0  . Calcium Carbonate-Vitamin D (CALCIUM + D PO) Take 1 tablet by mouth daily. Unknown strength    . Cholecalciferol (VITAMIN D PO) Take 1 tablet by mouth daily. Unknown strength    . diclofenac sodium (VOLTAREN) 1 % GEL Apply 1 application topically daily as needed.  0  . furosemide (LASIX) 40 MG tablet Take 40 mg by mouth daily.    Marland Kitchen gabapentin (NEURONTIN) 300 MG capsule Take 1 capsule by mouth 2 (two) times daily.  1  . metoprolol tartrate (LOPRESSOR) 25 MG tablet Take 25 mg by mouth daily.     . Multiple Vitamins-Minerals (HAIR VITAMINS PO) Take 1 tablet by mouth daily.    . Multiple Vitamins-Minerals (MULTIVITAMIN WITH MINERALS) tablet Take 1 tablet by mouth daily.    Marland Kitchen omeprazole (PRILOSEC) 20 MG  capsule Take 1 capsule by mouth daily.  0  . ondansetron (ZOFRAN) 8 MG tablet Take 1 tablet by mouth every 8 (eight) hours as needed.  0  . oxybutynin (DITROPAN XL) 15 MG 24 hr tablet Take 15 mg by mouth daily.    . polyethylene glycol powder (GLYCOLAX/MIRALAX) powder Take 17 g by mouth daily.  1  . potassium chloride (K-DUR) 10 MEQ tablet Take 10 mEq by mouth daily.    Marland Kitchen SAXENDA 18 MG/3ML SOPN Inject 2.4 Units into the skin daily.  0  . topiramate (TOPAMAX) 25 MG tablet Take 50 mg by mouth 2 (two) times daily.    .  traMADol (ULTRAM) 50 MG tablet Take 1 tablet by mouth daily.  0  . vitamin C (ASCORBIC ACID) 500 MG tablet Take 1 tablet (500 mg total) by mouth daily. 90 tablet 0    No results found for this or any previous visit (from the past 48 hour(s)). No results found.  Review of Systems  Constitutional: Negative for chills and fever.  Eyes: Negative for double vision.  Respiratory: Negative for cough and shortness of breath.   Cardiovascular: Positive for chest pain. Negative for orthopnea and claudication.  Gastrointestinal: Negative for abdominal pain, nausea and vomiting.  Genitourinary: Negative for dysuria.  Neurological: Negative for dizziness.    Blood pressure (!) 95/58, pulse 63, temperature 98.4 F (36.9 C), temperature source Oral, resp. rate 16, height '5\' 3"'  (1.6 m), weight 166 lb 6.4 oz (75.5 kg), SpO2 98 %. Physical Exam  Constitutional: She is oriented to person, place, and time.  HENT:  Head: Normocephalic and atraumatic.  Eyes: Conjunctivae are normal. Pupils are equal, round, and reactive to light. Left eye exhibits no discharge. No scleral icterus.  Neck: Normal range of motion. Neck supple. No JVD present. No tracheal deviation present. No thyromegaly present.  Cardiovascular: Normal rate and regular rhythm.   Murmur (Soft systolic murmur noted no S3 gallop) heard. Respiratory: Effort normal and breath sounds normal. No respiratory distress. She has no wheezes. She has no rales.  Musculoskeletal: She exhibits no edema, tenderness or deformity.  Neurological: She is alert and oriented to person, place, and time.     Assessment/Plan Unstable angina MI ruled out positive stress Myoview Hypertension Hyperlipidemia GERD History of migraine headaches Strong family history of coronary artery disease Chronic anemia Plan As per orders Discussed with patient and her family regarding nuclear stress test result and left cardiac cath possible PTCA stenting its risk and  benefits i.e. death MI stroke need for emergency CABG local vascular complications etc. and consents for PCI Charolette Forward, MD 09/13/2016, 10:57 PM

## 2016-09-13 NOTE — Progress Notes (Signed)
ANTICOAGULATION CONSULT NOTE - Initial Consult  Pharmacy Consult for heparin Indication: chest pain/ACS  No Known Allergies  Patient Measurements: Height: 5\' 3"  (160 cm) Weight: 166 lb 6.4 oz (75.5 kg) IBW/kg (Calculated) : 52.4 Heparin Dosing Weight: 70kg  Vital Signs: Temp: 98.4 F (36.9 C) (11/21 2245) Temp Source: Oral (11/21 2245) BP: 95/58 (11/21 2245) Pulse Rate: 63 (11/21 2245)   Medical History: Past Medical History:  Diagnosis Date  . Anemia   . Arthritis   . Contact lens/glasses fitting    wears glasses or contacts  . Headache(784.0)   . Heart murmur   . Hyperlipemia   . Hypertension     Medications:  Prescriptions Prior to Admission  Medication Sig Dispense Refill Last Dose  . aspirin 325 MG tablet Take 325 mg by mouth daily.   Taking  . atorvastatin (LIPITOR) 20 MG tablet Take 1 tablet by mouth daily.  0 Taking  . Calcium Carbonate-Vitamin D (CALCIUM + D PO) Take 1 tablet by mouth daily. Unknown strength   Taking  . Cholecalciferol (VITAMIN D PO) Take 1 tablet by mouth daily. Unknown strength   Taking  . diclofenac sodium (VOLTAREN) 1 % GEL Apply 1 application topically daily as needed.  0 Taking  . furosemide (LASIX) 40 MG tablet Take 40 mg by mouth daily.   Taking  . gabapentin (NEURONTIN) 300 MG capsule Take 1 capsule by mouth 2 (two) times daily.  1 Taking  . metoprolol tartrate (LOPRESSOR) 25 MG tablet Take 25 mg by mouth daily.    Taking  . Multiple Vitamins-Minerals (HAIR VITAMINS PO) Take 1 tablet by mouth daily.   Taking  . Multiple Vitamins-Minerals (MULTIVITAMIN WITH MINERALS) tablet Take 1 tablet by mouth daily.   Taking  . omeprazole (PRILOSEC) 20 MG capsule Take 1 capsule by mouth daily.  0 Taking  . ondansetron (ZOFRAN) 8 MG tablet Take 1 tablet by mouth every 8 (eight) hours as needed.  0 Taking  . oxybutynin (DITROPAN XL) 15 MG 24 hr tablet Take 15 mg by mouth daily.   Taking  . polyethylene glycol powder (GLYCOLAX/MIRALAX) powder Take  17 g by mouth daily.  1 Taking  . potassium chloride (K-DUR) 10 MEQ tablet Take 10 mEq by mouth daily.   Taking  . SAXENDA 18 MG/3ML SOPN Inject 2.4 Units into the skin daily.  0 Taking  . topiramate (TOPAMAX) 25 MG tablet Take 50 mg by mouth 2 (two) times daily.   Taking  . traMADol (ULTRAM) 50 MG tablet Take 1 tablet by mouth daily.  0 Taking  . vitamin C (ASCORBIC ACID) 500 MG tablet Take 1 tablet (500 mg total) by mouth daily. 90 tablet 0 Taking   Scheduled:  . aspirin  324 mg Oral NOW   Or  . aspirin  300 mg Rectal NOW  . [START ON 09/14/2016] aspirin EC  81 mg Oral Daily  . [START ON 09/14/2016] atorvastatin  40 mg Oral q1800  . clopidogrel  300 mg Oral Once  . [START ON 09/14/2016] ferrous sulfate  325 mg Oral BID WC  . [START ON 09/14/2016] metoprolol tartrate  25 mg Oral Daily  . [START ON 09/14/2016] pantoprazole  40 mg Oral Daily  . [START ON 09/14/2016] polyethylene glycol  17 g Oral Daily  . sodium chloride flush  3 mL Intravenous Q12H  . topiramate  50 mg Oral BID   Infusions:  . sodium chloride    . sodium chloride    . nitroGLYCERIN  Assessment: 65yo female c/o 10/10 retrosternal CP, [facility], to begin heparin.  Goal of Therapy:  Heparin level 0.3-0.7 units/ml Monitor platelets by anticoagulation protocol: Yes   Plan:  Will give heparin 4000 units IV bolus x1 followed by gtt at 1000 units/hr and monitor heparin levels and CBC.  Vernard GamblesVeronda Jaycob Mcclenton, PharmD, BCPS  09/13/2016,11:23 PM

## 2016-09-14 ENCOUNTER — Encounter (HOSPITAL_COMMUNITY)
Admission: AD | Disposition: A | Discharge status: Home / Self Care | Payer: Self-pay | Source: Other Acute Inpatient Hospital | Attending: Cardiology

## 2016-09-14 ENCOUNTER — Encounter (HOSPITAL_COMMUNITY): Payer: Self-pay | Admitting: General Practice

## 2016-09-14 ENCOUNTER — Ambulatory Visit (HOSPITAL_COMMUNITY): Admission: RE | Admit: 2016-09-14 | Payer: Medicare HMO | Source: Ambulatory Visit | Admitting: Cardiology

## 2016-09-14 DIAGNOSIS — I10 Essential (primary) hypertension: Secondary | ICD-10-CM | POA: Diagnosis not present

## 2016-09-14 DIAGNOSIS — I2510 Atherosclerotic heart disease of native coronary artery without angina pectoris: Secondary | ICD-10-CM | POA: Diagnosis not present

## 2016-09-14 DIAGNOSIS — R9439 Abnormal result of other cardiovascular function study: Secondary | ICD-10-CM | POA: Diagnosis not present

## 2016-09-14 DIAGNOSIS — R072 Precordial pain: Secondary | ICD-10-CM | POA: Diagnosis not present

## 2016-09-14 HISTORY — PX: CARDIAC CATHETERIZATION: SHX172

## 2016-09-14 LAB — BASIC METABOLIC PANEL
Anion gap: 7 (ref 5–15)
BUN: 12 mg/dL (ref 6–20)
CO2: 24 mmol/L (ref 22–32)
Calcium: 8.6 mg/dL — ABNORMAL LOW (ref 8.9–10.3)
Chloride: 110 mmol/L (ref 101–111)
Creatinine, Ser: 0.92 mg/dL (ref 0.44–1.00)
GFR calc Af Amer: >60 mL/min (ref >60)
GFR calc non Af Amer: >60 mL/min (ref >60)
Glucose, Bld: 122 mg/dL — ABNORMAL HIGH (ref 65–99)
Potassium: 3.3 mmol/L — ABNORMAL LOW (ref 3.5–5.1)
Sodium: 141 mmol/L (ref 135–145)

## 2016-09-14 LAB — CBC
HCT: 31.3 % — ABNORMAL LOW (ref 36.0–46.0)
Hemoglobin: 9.8 g/dL — ABNORMAL LOW (ref 12.0–15.0)
MCH: 27.2 pg (ref 26.0–34.0)
MCHC: 31.3 g/dL (ref 30.0–36.0)
MCV: 86.9 fL (ref 78.0–100.0)
Platelets: 355 10*3/uL (ref 150–400)
RBC: 3.6 MIL/uL — ABNORMAL LOW (ref 3.87–5.11)
RDW: 13.6 % (ref 11.5–15.5)
WBC: 7.7 10*3/uL (ref 4.0–10.5)

## 2016-09-14 LAB — TROPONIN I
Troponin I: <0.03 ng/mL (ref <0.03)
Troponin I: <0.03 ng/mL (ref <0.03)
Troponin I: <0.03 ng/mL (ref <0.03)

## 2016-09-14 LAB — LIPID PANEL
Cholesterol: 190 mg/dL (ref 0–200)
HDL: 55 mg/dL (ref >40)
LDL Cholesterol: 125 mg/dL — ABNORMAL HIGH (ref 0–99)
Total CHOL/HDL Ratio: 3.5 RATIO
Triglycerides: 50 mg/dL (ref <150)
VLDL: 10 mg/dL (ref 0–40)

## 2016-09-14 LAB — PROTIME-INR
INR: 1.1
Prothrombin Time: 14.2 seconds (ref 11.4–15.2)

## 2016-09-14 LAB — POCT ACTIVATED CLOTTING TIME: Activated Clotting Time: 158 seconds

## 2016-09-14 LAB — HEPARIN LEVEL (UNFRACTIONATED): Heparin Unfractionated: 0.84 IU/mL — ABNORMAL HIGH (ref 0.30–0.70)

## 2016-09-14 SURGERY — LEFT HEART CATH AND CORONARY ANGIOGRAPHY
Anesthesia: LOCAL

## 2016-09-14 MED ORDER — SODIUM CHLORIDE 0.9% FLUSH: 3.0000 mL | INTRAVENOUS | Status: DC | PRN

## 2016-09-14 MED ORDER — FERROUS SULFATE 325 (65 FE) MG PO TABS: 325.0000 mg | ORAL_TABLET | Freq: Two times a day (BID) | ORAL | 3 refills | Status: DC

## 2016-09-14 MED ORDER — NITROGLYCERIN 0.4 MG SL SUBL: 0.4000 mg | SUBLINGUAL_TABLET | SUBLINGUAL | 12 refills | Status: DC | PRN

## 2016-09-14 MED ORDER — IOPAMIDOL (ISOVUE-370) INJECTION 76%
INTRAVENOUS | Status: DC | PRN
  Administered 2016-09-14: 50 mL via INTRA_ARTERIAL

## 2016-09-14 MED ORDER — MIDAZOLAM HCL 2 MG/2ML IJ SOLN
INTRAMUSCULAR | Status: DC | PRN
  Administered 2016-09-14 (×2): 1 mg via INTRAVENOUS

## 2016-09-14 MED ORDER — HEPARIN (PORCINE) IN NACL 2-0.9 UNIT/ML-% IJ SOLN
INTRAMUSCULAR | Status: DC | PRN
  Administered 2016-09-14: 1000 mL

## 2016-09-14 MED ORDER — SODIUM CHLORIDE 0.9 % IV SOLN: INTRAVENOUS | Status: DC

## 2016-09-14 MED ORDER — LIDOCAINE HCL (PF) 1 % IJ SOLN
INTRAMUSCULAR | Status: DC | PRN
  Administered 2016-09-14: 15 mL

## 2016-09-14 MED ORDER — SODIUM CHLORIDE 0.9 % IV SOLN: 250.0000 mL | INTRAVENOUS | Status: DC | PRN

## 2016-09-14 MED ORDER — FENTANYL CITRATE (PF) 100 MCG/2ML IJ SOLN
INTRAMUSCULAR | Status: DC | PRN
  Administered 2016-09-14 (×2): 25 ug via INTRAVENOUS

## 2016-09-14 MED ORDER — SODIUM CHLORIDE 0.9% FLUSH
3.0000 mL | Freq: Two times a day (BID) | INTRAVENOUS | Status: DC
  Administered 2016-09-14: 3 mL via INTRAVENOUS

## 2016-09-14 MED ORDER — POTASSIUM CHLORIDE CRYS ER 20 MEQ PO TBCR
40.0000 meq | EXTENDED_RELEASE_TABLET | Freq: Once | ORAL | Status: AC
  Administered 2016-09-14: 40 meq via ORAL
  Filled 2016-09-14: qty 2

## 2016-09-14 SURGICAL SUPPLY — 7 items
CATH INFINITI 5FR MULTPACK ANG (CATHETERS) ×1 IMPLANT
KIT HEART LEFT (KITS) ×2 IMPLANT
PACK CARDIAC CATHETERIZATION (CUSTOM PROCEDURE TRAY) ×2 IMPLANT
SHEATH PINNACLE 5F 10CM (SHEATH) ×2 IMPLANT
SYR MEDRAD MARK V 150ML (SYRINGE) ×2 IMPLANT
TRANSDUCER W/STOPCOCK (MISCELLANEOUS) ×2 IMPLANT
WIRE EMERALD 3MM-J .035X150CM (WIRE) ×1 IMPLANT

## 2016-09-14 NOTE — Progress Notes (Signed)
NPO for cardiac cath; ate late due to transfer here.

## 2016-09-14 NOTE — Interval H&P Note (Signed)
Cath Lab Visit (complete for each Cath Lab visit)  Clinical Evaluation Leading to the Procedure:   ACS: No.  Non-ACS:    Anginal Classification: CCS III  Anti-ischemic medical therapy: Maximal Therapy (2 or more classes of medications)  Non-Invasive Test Results: Intermediate-risk stress test findings: cardiac mortality 1-3%/year  Prior CABG: No previous CABG      History and Physical Interval Note:  09/14/2016 1:14 PM  Brandi Russell  has presented today for surgery, with the diagnosis of unstable angina  The various methods of treatment have been discussed with the patient and family. After consideration of risks, benefits and other options for treatment, the patient has consented to  Procedure(s): Left Heart Cath and Coronary Angiography (N/A) as a surgical intervention .  The patient's history has been reviewed, patient examined, no change in status, stable for surgery.  I have reviewed the patient's chart and labs.  Questions were answered to the patient's satisfaction.     Brandi CloudHarwani, Brandi Russell

## 2016-09-14 NOTE — Discharge Instructions (Signed)
Coronary Angiogram °A coronary angiogram is an X-ray procedure that is used to examine the arteries in the heart. In this procedure, a dye (contrast dye) is injected through a long, thin tube (catheter). The catheter is inserted through the groin, wrist, or arm. The dye is injected into each artery, then X-rays are taken to show if there is a blockage in the arteries of the heart. This procedure can also show if you have valve disease or a disease of the aorta, and it can be used to check the overall function of your heart muscle. You may have a coronary angiogram if: °· You are having chest pain, or other symptoms of angina, and you are at risk for heart disease. °· You have an abnormal electrocardiogram (ECG) or stress test. °· You have chest pain and heart failure. °· You are having irregular heart rhythms. °· You and your health care provider determine that the benefits of the test information outweigh the risks of the procedure. °Let your health care provider know about: °· Any allergies you have, including allergies to contrast dye. °· All medicines you are taking, including vitamins, herbs, eye drops, creams, and over-the-counter medicines. °· Any problems you or family members have had with anesthetic medicines. °· Any blood disorders you have. °· Any surgeries you have had. °· History of kidney problems or kidney failure. °· Any medical conditions you have. °· Whether you are pregnant or may be pregnant. °What are the risks? °Generally, this is a safe procedure. However, problems may occur, including: °· Infection. °· Allergic reaction to medicines or dyes that are used. °· Bleeding from the access site or other locations. °· Kidney injury, especially in people with impaired kidney function. °· Stroke (rare). °· Heart attack (rare). °· Damage to other structures or organs. °What happens before the procedure? °Staying hydrated  °Follow instructions from your health care provider about hydration, which may  include: °· Up to 2 hours before the procedure - you may continue to drink clear liquids, such as water, clear fruit juice, black coffee, and plain tea. °Eating and drinking restrictions  °Follow instructions from your health care provider about eating and drinking, which may include: °· 8 hours before the procedure - stop eating heavy meals or foods such as meat, fried foods, or fatty foods. °· 6 hours before the procedure - stop eating light meals or foods, such as toast or cereal. °· 2 hours before the procedure - stop drinking clear liquids. °General instructions  °· Ask your health care provider about: °¨ Changing or stopping your regular medicines. This is especially important if you are taking diabetes medicines or blood thinners. °¨ Taking medicines such as ibuprofen. These medicines can thin your blood. Do not take these medicines before your procedure if your health care provider instructs you not to, though aspirin may be recommended prior to coronary angiograms. °· Plan to have someone take you home from the hospital or clinic. °· You may need to have blood tests or X-rays done. °What happens during the procedure? °· An IV tube will be inserted into one of your veins. °· You will be given one or more of the following: °¨ A medicine to help you relax (sedative). °¨ A medicine to numb the area where the catheter will be inserted into an artery (local anesthetic). °· To reduce your risk of infection: °¨ Your health care team will wash or sanitize their hands. °¨ Your skin will be washed with soap. °¨ Hair   may be removed from the area where the catheter will be inserted. °· You will be connected to a continuous ECG monitor. °· The catheter will be inserted into an artery. The location may be in your groin, in your wrist, or in the fold of your arm (near your elbow). °· A type of X-ray (fluoroscopy) will be used to help guide the catheter to the opening of the blood vessel that is being examined. °· A dye  will be injected into the catheter, and X-rays will be taken. The dye will help to show where any narrowing or blockages are located in the heart arteries. °· Tell your health care provider if you have any chest pain or trouble breathing during the procedure. °· If blockages are found, your health care provider may perform another procedure, such as inserting a coronary stent. °The procedure may vary among health care providers and hospitals. °What happens after the procedure? °· After the procedure, you will need to keep the area still for a few hours, or for as long as told by your health care provider. If the procedure is done through the groin, you will be instructed to not bend and not cross your legs. °· The insertion site will be checked frequently. °· The pulse in your foot or wrist will be checked frequently. °· You may have additional blood tests, X-rays, and a test that records the electrical activity of your heart (ECG). °· Do not drive for 24 hours if you were given a sedative. °Summary °· A coronary angiogram is an X-ray procedure that is used to look into the arteries in the heart. °· During the procedure, a dye (contrast dye) is injected through a long, thin tube (catheter). The catheter is inserted through the groin, wrist, or arm. °· Tell your health care provider about any allergies you have, including allergies to contrast dye. °· After the procedure, you will need to keep the area still for a few hours, or for as long as told by your health care provider. °This information is not intended to replace advice given to you by your health care provider. Make sure you discuss any questions you have with your health care provider. °Document Released: 04/16/2003 Document Revised: 07/22/2016 Document Reviewed: 07/22/2016 °[employer] Interactive Patient Education © 2017 [employer] ° °

## 2016-09-14 NOTE — Discharge Summary (Signed)
NAME:  Brandi Russell, Brandi Russell                ACCOUNT NO.:  [id number]  MEDICAL RECORD NO.:  [id number]  LOCATION:                                 FACILITY:  PHYSICIAN:  Alfio Loescher N. [name], M.D. DATE OF BIRTH:  [birth year]  DATE OF ADMISSION:  09/13/2016 DATE OF DISCHARGE:  09/14/2016                              DISCHARGE SUMMARY   ADMITTING DIAGNOSES: 1. Unstable angina, myocardial infarction ruled out, positive nuclear     stress test. 2. Hypertension. 3. Hyperlipidemia. 4. Gastroesophageal reflux disease. 5. History of migraine headache. 6. Strong family history of coronary artery disease. 7. Chronic anemia.  DISCHARGE DIAGNOSES: 1. Status post chest pain, myocardial infarction ruled out, positive     nuclear stress test, status post left cardiac catheterization. 2. Nonobstructive coronary artery disease. 3. Hypertension. 4. Hyperlipidemia. 5. Gastroesophageal reflux disease. 6. History of migraine headaches. 7. Positive family history of coronary artery disease. 8. Chronic anemia.  DISCHARGE HOME MEDICATIONS: 1. Ferrous sulfate 325 mg 1 tablet twice daily. 2. Nitrostat 0.4 mg sublingual use as directed. 3. Aspirin 81 mg 1 tablet daily. 4. Atorvastatin 20 mg daily. 5. Celebrex 200 mg daily as before. 6. Diclofenac sodium 1% gel apply locally as before. 7. Metoprolol 50 mg in the morning and 25 mg in the evening. 8. Omeprazole 20 mg daily. 9. Oxybutynin 15 mg daily. 10.Scopolamine 1 mg for 3 days, one patch every 3 days as before. 11.Imitrex as needed. 12.Topamax 50 mg daily at bedtime.  DIET:  Low salt, low cholesterol diet.  Post-cardiac cath instructions have been given.  FOLLOWUP:  Follow up with me in 1 week.  CONDITION AT DISCHARGE:  Stable.  BRIEF HISTORY AND HOSPITAL COURSE:  Brandi Russell is a 65 year old female with past medical history significant for hypertension; hyperlipidemia; strong family history of coronary artery disease, mother, father, and brother had  PCIs in the 62s and 70s; GERD; chronic anemia, had extensive GI workup, including bone marrow biopsy in the past; degenerative joint disease; history of migraine headaches.  She was transferred from [facility].  The patient complained of retrosternal chest pain, which started around 6 p.m. yesterday while in kitchen, described as sharp and heaviness, grade 10/10, radiating to the left shoulder and back without associated nausea, vomiting, or diaphoresis.  The patient denies shortness of breath.  States chest pain was waxing and waning. Finally, decided to go to ED.  MI was ruled out by serial enzymes and EKG and subsequently underwent stress Myoview at [facility] which showed anteroapical wall ischemia with EF of 63%.  The patient was transferred to [facility] for further management.  The patient presently denies any chest pain, on IV nitro drip.  Denies any history of exertional chest pain, but gives history of exertional dyspnea associated with feeling weak, tired, and fatigued.  PHYSICAL EXAMINATION:  GENERAL:  She was alert, awake, oriented x3. VITAL SIGNS:  Her blood pressure was 95/58, pulse 63, she was afebrile. HEENT:  Conjunctivae pink. NECK:  Supple.  No JVD.  No bruit. LUNGS:  Clear to auscultation without rhonchi or rales. CARDIOVASCULAR:  S1, S2 normal.  There was soft systolic murmur.  No S3 or  S4 gallop. ABDOMEN:  Soft.  Bowel sounds were present.  Nontender. EXTREMITIES:  There is no clubbing, cyanosis, or edema.  LABS:  Three sets of troponin-I were normal.  Potassium was 3.3, which has been replaced.  BUN 12, creatinine 0.92, glucose was 122. Cholesterol 190, triglycerides 50, LDL 125.  Hemoglobin was 9.8, hematocrit 31.3, white count of 7.7 which has been stable.  BRIEF HOSPITAL COURSE:  The patient was admitted to telemetry unit.  MI was ruled out by serial enzymes and EKG.  The patient did not have any further episodes of chest pain during  the hospital stay.  The patient had nuclear stress test yesterday which showed reversible ischemia in the anteroseptal wall as above.  The patient subsequently underwent left cardiac catheterization with selective left and right coronary angiography, LV graphy.  As per procedure report, the patient was noted to have nonobstructive CAD.  Postprocedure, the patient did not have any episodes of chest pain.  Her groin is stable with no evidence of hematoma or bruit.  The patient has ambulated in room without any problem.  The patient will be discharged home on above medications and will be followed up in my office in 1 week.  We will recheck her fasting blood sugar as outpatient and hemoglobin A1c as appropriate.  The patient has been advised to refrain from sweets and carbohydrates.     [name]. [name], M.D.     MNH/MEDQ  D:  09/14/2016  T:  09/14/2016  Job:  [id number]

## 2016-09-14 NOTE — Plan of Care (Signed)
Problem: Education: Goal: Knowledge of  General Education information/materials will improve Outcome: Progressing POC reviewed with pt.  Problem: Education: Goal: Understanding of CV disease, CV risk reduction, and recovery process will improve Outcome: Progressing Pt. watched Cardiac cath. Video; explained NTG, and Heparin drip to pt.

## 2016-09-14 NOTE — Discharge Summary (Signed)
Discharge summary dictated on 09/14/2016, dictation number is [phone]

## 2016-09-14 NOTE — Progress Notes (Signed)
[facility]. [facility] to [facility] 3W- 13; walked from stretcher to bed; alert and oriented x4; pt. oriented to room and hospital policies; family with pt.

## 2016-09-14 NOTE — Progress Notes (Signed)
ANTICOAGULATION CONSULT NOTE - Follow Up Consult  Pharmacy Consult for heparin Indication: chest pain/ACS  Labs:  Recent Labs  09/13/16 2354 09/14/16 0533  HGB  --  9.8*  HCT  --  31.3*  PLT  --  355  LABPROT  --  14.2  INR  --  1.10  HEPARINUNFRC  --  0.84*  TROPONINI <0.03  --     Assessment: 65yo female above goal on heparin with initial dosing for CP.  Goal of Therapy:  Heparin level 0.3-0.7 units/ml   Plan:  Will decrease heparin gtt by 1-2 units/kg/hr to 900 units/hr and check level in 6hr.  Vernard GamblesVeronda Canna Nickelson, PharmD, BCPS  09/14/2016,7:07 AM

## 2016-09-14 NOTE — Progress Notes (Signed)
595fr sheath removed intact from right femoral artery. Manual pressure applied x 25min. 4x4 gauze dressing applied to site. No bleeding or hematoma present. Patient tolerated well. VSS. Post activity and precautions explained; patient verbalized understanding. Will transport to 3w13.

## 2016-09-14 NOTE — Care Management Obs Status (Signed)
MEDICARE OBSERVATION STATUS NOTIFICATION   Patient Details  Name: Brandi Russell MRN: [id number] Date of Birth: 04/19/1951   Medicare Observation Status Notification Given:  Yes    Gala LewandowskyGraves-Bigelow, Anahy Esh Kaye, RN 09/14/2016, 10:34 AM

## 2016-09-16 ENCOUNTER — Encounter (HOSPITAL_COMMUNITY): Payer: Self-pay | Admitting: Cardiology

## 2016-09-19 MED FILL — Nitroglycerin IV Soln 200 MCG/ML in D5W: INTRAVENOUS | Qty: 250 | Status: AC

## 2016-11-07 NOTE — Progress Notes (Signed)
Cardiology Office Note    Date:  11/08/2016   ID:  [name], DOB [birth year], MRN [id number]  PCP:  Brandi Doe, MD  Cardiologist: Dr. Jens Russell  Chief Complaint  Patient presents with  . Follow-up    History of Present Illness:    Brandi Russell is a 66 y.o. female with past medical history of chest pain (normal cors by cath in 2007), HTN, HLD, GERD, and chronic anemia who presents to the office for Emergency Department follow-up.   Was last seen by Dr. Jens Russell in 09/2015 and reported having chest discomfort at that time. Was described as a sharp pain, not worse with exertion or positional changes. NST was performed at that time that showed no evidence of ischemia and was overall low-risk. Echocardiogram showed normal EF of 55-60% with Grade 2 DD and no significant valve abnormalities.   Was admitted at [facility] from 11/21- 09/14/2016 as a transfer from [facility] for chest pain. A nuclear scan had showed anteroapical wall ischemia and she was transferred to [facility] for further evaluation. Cyclic troponin values remained negative. A cardiac catheterization was performed by Dr. Sharyn Russell which showed minimal, nonobstructive CAD with 30% LM stenosis, 20% Mid-RCA stenosis, and 20% RPDA stenosis.   In talking with the patient today, she reports having a severe episode of chest pain this past Sunday. Reports it was a 10/10 pain which came on all at once and was partially relieved with sublingual nitroglycerin. EMS was called and initially she was not going to be taken to the hospital but her pain represented and they recommended she go for further evaluation. Her son brings with her records today which are reviewed. Cardiac enzymes were normal at that time and her EKG did not show any acute ischemic changes. D-dimer was minimally elevated at 0.69 and a CTA was performed which was negative for a PE. Reports her pain was relieved with Morphine while in the ED and she denies any  repeat episodes since.  She is very anxious about having a repeat episode of this pain, as she reports it was very severe. Since her recent ER visit, she has returned to exercising daily and denies any episodes of chest discomfort or dyspnea with exertion with this. She does report her heart rate will become elevated into the 200's with exercise and reports associated palpitations with this. Has also noted palpitations occurring randomly while at rest, which can last for several minutes at a time and then spontaneously resolve. Denies any associated dizziness, lightheadedness, or presyncope.   Past Medical History:  Diagnosis Date  . Anemia   . Arthritis   . CAD (coronary artery disease)    a. 08/2016: minimal, nonobstructive CAD by cath with 30% LM stenosis, 20% Mid-RCA stenosis, and 20% RPDA stenosis.   . Chest pain 09/13/2016  . Contact lens/glasses fitting    wears glasses or contacts  . Headache(784.0)   . Heart murmur   . Hyperlipemia   . Hypertension     Past Surgical History:  Procedure Laterality Date  . BUNIONECTOMY     both feet  . CARDIAC CATHETERIZATION  [place]   [place]  . CARDIAC CATHETERIZATION N/A 09/14/2016   Procedure: Left Heart Cath and Coronary Angiography;  Surgeon: Brandi Cloud, MD;  Location: [facility] INVASIVE CV LAB;  Service: Cardiovascular;  Laterality: N/A;  . COLONOSCOPY    . HAMMERTOE RECONSTRUCTION WITH WEIL OSTEOTOMY  2006   rt foot  . HAMMERTOE RECONSTRUCTION WITH WEIL OSTEOTOMY AND  GASTROC SLIDE  09/26/2012   Procedure: HAMMERTOE RECONSTRUCTION WITH WEIL OSTEOTOMY AND GASTROC SLIDE;  Surgeon: Brandi RadPaul A Bednarz, MD;  Location:  SURGERY CENTER;  Service: Orthopedics;  Laterality: Left;  LEFT GASTROC SLIDE, 2ND/3RD WEIL METATARSAL SHORTING OSTEOTOMIES, 2ND through 5th TOES MTP JOINT, DORSAL CAPSULOTOMY, HAMMER TOE RECONSTRUCTION,  FDL TO PROXIMAL PHALANX TENDON TRANSFER    Current Medications: Outpatient Medications Prior to Visit  Medication Sig  Dispense Refill  . aspirin 81 MG tablet Take 81 mg by mouth daily.     Marland Kitchen. atorvastatin (LIPITOR) 20 MG tablet Take 20 mg by mouth daily.   0  . celecoxib (CELEBREX) 200 MG capsule Take 200 mg by mouth daily.    . diclofenac sodium (VOLTAREN) 1 % GEL Apply 1 application topically daily as needed (for pain).   0  . ferrous sulfate 325 (65 FE) MG tablet Take 1 tablet (325 mg total) by mouth 2 (two) times daily with a meal. 60 tablet 3  . metoprolol tartrate (LOPRESSOR) 25 MG tablet Take 25-50 mg by mouth See admin instructions. Take 50 mg by mouth in the morning and take 25 mg by mouth in the evening    . nitroGLYCERIN (NITROSTAT) 0.4 MG SL tablet Place 1 tablet (0.4 mg total) under the tongue every 5 (five) minutes x 3 doses as needed for chest pain. 25 tablet 12  . omeprazole (PRILOSEC) 20 MG capsule Take 20 mg by mouth daily.    Marland Kitchen. oxybutynin (DITROPAN XL) 15 MG 24 hr tablet Take 15 mg by mouth daily.    Marland Kitchen. scopolamine (TRANSDERM-SCOP) 1 MG/3DAYS Place 1 patch onto the skin every 3 (three) days.    . SUMAtriptan (IMITREX) 25 MG tablet Take 25 mg by mouth every 2 (two) hours as needed for migraine. May repeat in 2 hours if headache persists or recurs.    . topiramate (TOPAMAX) 25 MG tablet Take 50 mg by mouth at bedtime.      No facility-administered medications prior to visit.      Allergies:   Patient has no known allergies.   Social History   Social History  . Marital status: Married    Spouse name: N/A  . Number of children: 2  . Years of education: N/A   Social History Main Topics  . Smoking status: Never Smoker  . Smokeless tobacco: Never Used  . Alcohol use No  . Drug use: No  . Sexual activity: Not Asked   Other Topics Concern  . None   Social History Narrative  . None     Family History:  The patient's family history includes CAD in her brother, brother, and father; Heart disease in her mother.   Review of Systems:   Please see the history of present illness.      General:  No chills, fever, night sweats or weight changes.  Cardiovascular:  No dyspnea on exertion, edema, orthopnea, paroxysmal nocturnal dyspnea. Positive for chest pain and palpitations.  Dermatological: No rash, lesions/masses Respiratory: No cough, dyspnea Urologic: No hematuria, dysuria Abdominal:   No nausea, vomiting, diarrhea, bright red blood per rectum, melena, or hematemesis Neurologic:  No visual changes, wkns, changes in mental status. All other systems reviewed and are otherwise negative except as noted above.   Physical Exam:    VS:  BP 106/62   Pulse 67   Ht 5\' 3"  (1.6 m)   Wt 170 lb (77.1 kg)   BMI 30.11 kg/m    General: Well developed, well  nourished African American female appearing in no acute distress. Head: Normocephalic, atraumatic, sclera non-icteric, no xanthomas, nares are without discharge.  Neck: No carotid bruits. JVD not elevated.  Lungs: Respirations regular and unlabored, without wheezes or rales.  Heart: Regular rate and rhythm. No S3 or S4.  No murmur, no rubs, or gallops appreciated. Abdomen: Soft, non-tender, non-distended with normoactive bowel sounds. No hepatomegaly. No rebound/guarding. No obvious abdominal masses. Msk:  Strength and tone appear normal for age. No joint deformities or effusions. Extremities: No clubbing or cyanosis. No edema.  Distal pedal pulses are 2+ bilaterally. Neuro: Alert and oriented X 3. Moves all extremities spontaneously. No focal deficits noted. Psych:  Responds to questions appropriately with a normal affect. Skin: No rashes or lesions noted  Wt Readings from Last 3 Encounters:  11/08/16 170 lb (77.1 kg)  09/13/16 166 lb 6.4 oz (75.5 kg)  10/21/15 164 lb (74.4 kg)      Studies/Labs Reviewed:   EKG:  EKG is not ordered today.   Recent Labs: 09/14/2016: BUN 12; Creatinine, Ser 0.92; Hemoglobin 9.8; Platelets 355; Potassium 3.3; Sodium 141   Lipid Panel    Component Value Date/Time   CHOL 190  09/14/2016 0533   TRIG 50 09/14/2016 0533   HDL 55 09/14/2016 0533   CHOLHDL 3.5 09/14/2016 0533   VLDL 10 09/14/2016 0533   LDLCALC 125 (H) 09/14/2016 0533    Additional studies/ records that were reviewed today include:   Cardiac Catheterization: 08/2016  LM lesion, 30 %stenosed.  Mid RCA lesion, 20 %stenosed.  RPDA lesion, 20 %stenosed.  Assessment:    1. Atypical chest pain   2. Palpitations   3. Essential hypertension   4. Hyperlipidemia, unspecified hyperlipidemia type      Plan:   In order of problems listed above:  1. Atypical Chest Pain - had a low-risk NST in 09/2015. Admitted in 08/2016 as a transfer from [facility] for chest pain after a nuclear scan showed anteroapical wall ischemia. Cardiac catheterization was performed by Dr. Sharyn Russell which showed minimal, nonobstructive CAD with 30% LM stenosis, 20% Mid-RCA stenosis, and 20% RPDA stenosis.  - recently seen at [facility] ER this past weekend for severe chest pain which occurred at rest and was partially relived with SL NTG, being fully relieved with Morphine. Cardiac enzymes were normal at that time and her EKG did not show any acute ischemic changes. CTA was negative for a PE. - denies any repeat episodes of chest pain since and has been exercising daily without recurrent symptoms.  - overall, her symptoms seem atypical for a cardiac etiology especially in the setting of minimal CAD by cath just 2 months prior. It is possible she has experienced a coronary vasospasm or might have small vessel disease, though neither of these were mentioned in the cath lab report.  - will start low-dose Imdur as this would hopefully be beneficial if her pain were due to an above etiology. With her normal cors, I advised she follow-up with her PCP to further examine for non-cardiac causes of her chest pain.   2. Palpitations - reports having frequent palpitations, occurring at rest and with exercise. Reports HR as high as 200  with exercise. Palpitations at rest can last for several minutes at a time and then spontaneously resolve. Denies any associated chest discomfort, dyspnea, dizziness, lightheadedness, or presyncope with these episodes. - will obtain 30-day cardiac event monitor to assess for arrhythmias.   3. HTN - BP well-controlled at 106/62 during  today's visit.  - continue Lasix and Lopressor.   4. HLD - Lipid Panel in 08/2016 showed total cholesterol 190, triglycerides 50, and LDL 125. - continue Atorvastatin 20mg  daily.    Medication Adjustments/Labs and Tests Ordered: Current medicines are reviewed at length with the patient today.  Concerns regarding medicines are outlined above.  Medication changes, Labs and Tests ordered today are listed in the Patient Instructions below. Patient Instructions  Medication Instructions:  START IMDUR 30MG  DAILY   If you need a refill on your cardiac medications before your next appointment, please call your pharmacy.  Labwork: NONE  Testing/Procedures: Your physician has recommended that you wear a 30 DAY event monitor. Event monitors are medical devices that record the heart's electrical activity. Doctors most often [place] these monitors to diagnose arrhythmias. Arrhythmias are problems with the speed or rhythm of the heartbeat. The monitor is a small, portable device. You can wear one while you do your normal daily activities. This is usually used to diagnose what is causing palpitations/syncope (passing out).  Follow-Up: Your physician wants you to follow-up in: 3 MONTHS WITH DR Brandi Russell You will receive a reminder letter in the mail two months in advance. If you don't receive a letter, please call our office IN February to schedule the follow-up appointment.   Lorri Frederick, PA  11/08/2016 10:03 PM    [facility] [address] [place], [address] [place], [place]  [address] Phone: [phone]; Fax: [phone]  [address], [address] [place], [place] [address] Phone: [phone]

## 2016-11-08 ENCOUNTER — Ambulatory Visit (INDEPENDENT_AMBULATORY_CARE_PROVIDER_SITE_OTHER): Payer: Medicare Other | Admitting: Student

## 2016-11-08 ENCOUNTER — Encounter: Payer: Self-pay | Admitting: Student

## 2016-11-08 VITALS — BP 106/62 | HR 67 | Ht 63.0 in | Wt 170.0 lb

## 2016-11-08 DIAGNOSIS — E785 Hyperlipidemia, unspecified: Secondary | ICD-10-CM | POA: Diagnosis not present

## 2016-11-08 DIAGNOSIS — I10 Essential (primary) hypertension: Secondary | ICD-10-CM | POA: Diagnosis not present

## 2016-11-08 DIAGNOSIS — R002 Palpitations: Secondary | ICD-10-CM | POA: Diagnosis not present

## 2016-11-08 DIAGNOSIS — R0789 Other chest pain: Secondary | ICD-10-CM | POA: Diagnosis not present

## 2016-11-08 MED ORDER — ISOSORBIDE MONONITRATE ER 30 MG PO TB24: 30.0000 mg | ORAL_TABLET | Freq: Every day | ORAL | 3 refills | Status: DC

## 2016-11-08 NOTE — Patient Instructions (Signed)
Medication Instructions:  START IMDUR 30MG  DAILY   If you need a refill on your cardiac medications before your next appointment, please call your pharmacy.  Labwork: NONE  Testing/Procedures: Your physician has recommended that you wear a 30 DAY event monitor. Event monitors are medical devices that record the heart's electrical activity. Doctors most often us these monitors to diagnose arrhythmias. Arrhythmias are problems with the speed or rhythm of the heartbeat. The monitor is a small, portable device. You can wear one while you do your normal daily activities. This is usually used to diagnose what is causing palpitations/syncope (passing out).  Follow-Up: Your physician wants you to follow-up in: 3 MONTHS WITH DR Jens SomRENSHAW You will receive a reminder letter in the mail two months in advance. If you don't receive a letter, please call our office IN February to schedule the follow-up appointment.   Thank you for choosing [facility] at [facility]!!    Michelle, LPN Randall AnBRITTANY STRADER, PA-C

## 2016-11-14 ENCOUNTER — Ambulatory Visit: Payer: Medicare HMO

## 2016-12-22 ENCOUNTER — Encounter: Payer: Self-pay | Admitting: *Deleted

## 2016-12-22 NOTE — Progress Notes (Signed)
Patient ID: Brandi Russell, female   DOB: 03/27/1951, 66 y.o.   MRN: [id number]  Patient enrolled for [employer] to mail a cardiac event monitor, MCT-3,  to her home.  What type of monitor to have shipped was discussed with Clinical manager, Dennis BastKelly Lanier, RN.  [employer] representative, [employer] Partners[employer]air Dagenhart, was notified.

## 2016-12-26 ENCOUNTER — Ambulatory Visit (INDEPENDENT_AMBULATORY_CARE_PROVIDER_SITE_OTHER): Payer: Medicare HMO

## 2016-12-26 ENCOUNTER — Telehealth: Payer: Self-pay | Admitting: *Deleted

## 2016-12-26 DIAGNOSIS — R002 Palpitations: Secondary | ICD-10-CM | POA: Diagnosis not present

## 2016-12-26 NOTE — Telephone Encounter (Signed)
Patient called to confirm monitor had arrived from [employer] and she had sent in her baseline.  Cardiac event monitor appointment was scheduled without knowledge the monitor had already been enrolled to ship.  OK to cancel.  She will be notified of her results from Dr. Raiford Simmondsrenshaw/ Debra Mathis, RN, approximately one week after End of Service.

## 2017-03-26 IMAGING — NM NM MISC PROCEDURE
6 series · 36 of 36 positions shown · non-contrast
Comparison: none

[Series 1: wbr_r-proj_st wbr rest · 6.40mm/px · 6 of 64 frames shown]
[frame 6/64]
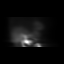
[frame 16/64]
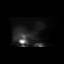
[frame 27/64]
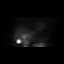
[frame 38/64]
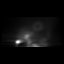
[frame 48/64]
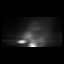
[frame 59/64]
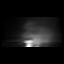

[Series 1: wbr rest · 6.40mm/px · 6 of 64 frames shown]
[frame 6/64]
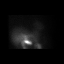
[frame 16/64]
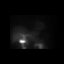
[frame 27/64]
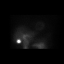
[frame 38/64]
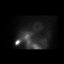
[frame 48/64]
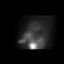
[frame 59/64]
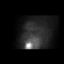

[Series 2: wbr_s-proj_st wbr stress-gsp · 6.40mm/px · 6 of 512 frames shown]
[frame 43/512]
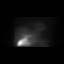
[frame 128/512]
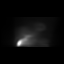
[frame 214/512]
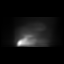
[frame 299/512]
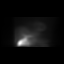
[frame 384/512]
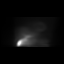
[frame 470/512]
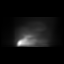

[Series 2: wbr stress-gsp · 6.40mm/px · 6 of 512 frames shown]
[frame 43/512]
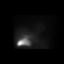
[frame 128/512]
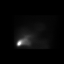
[frame 214/512]
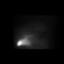
[frame 299/512]
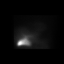
[frame 384/512]
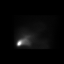
[frame 470/512]
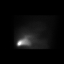

[Series 3: wbr stress-sum-em · 6.40mm/px · 6 of 64 frames shown]
[frame 6/64]
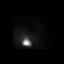
[frame 16/64]
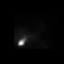
[frame 27/64]
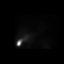
[frame 38/64]
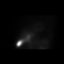
[frame 48/64]
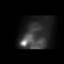
[frame 59/64]
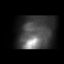

[Series 3: wbr_s-proj_st wbr stress-sum-em · 6.40mm/px · 6 of 64 frames shown]
[frame 6/64]
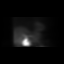
[frame 16/64]
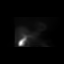
[frame 27/64]
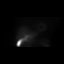
[frame 38/64]
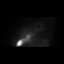
[frame 48/64]
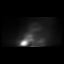
[frame 59/64]
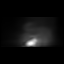

[36 of 36 positions shown; findings below may reference images not displayed]

Canned report from images found in remote index.

Refer to host system for actual result text.

## 2017-03-31 ENCOUNTER — Other Ambulatory Visit: Payer: Self-pay | Admitting: Student

## 2017-05-30 ENCOUNTER — Other Ambulatory Visit: Payer: Self-pay | Admitting: Cardiology

## 2017-05-30 MED ORDER — ISOSORBIDE MONONITRATE ER 30 MG PO TB24
30.0000 mg | ORAL_TABLET | Freq: Every day | ORAL | 1 refills | Status: DC
Start: 2017-05-30 — End: 2017-08-24

## 2017-05-30 NOTE — Telephone Encounter (Signed)
Patient notified directly notified. She wanted to know if she could cancel her appointment with Randall AnBrittany Strader and only see Dr Jens Somrenshaw in November. I gave her enough Imdur to last her to her appointment with Dr Jens Somrenshaw. Patient voiced understanding.

## 2017-05-30 NOTE — Telephone Encounter (Signed)
New Message      *STAT* If patient is at the pharmacy, call can be transferred to refill team.   1. Which medications need to be refilled? (please list name of each medication and dose if known)   isosorbide mononitrate (IMDUR) 30 MG 24 hr tablet Take 1 tablet (30 mg total) by mouth daily. Needs follow up appointment for further refills     2. Which pharmacy/location (including street and city if local pharmacy) is medication to be sent to? [facility]  3. Do they need a 30 day or 90 day supply?  30

## 2017-06-23 ENCOUNTER — Ambulatory Visit: Payer: Medicare HMO | Admitting: Student

## 2017-08-16 NOTE — Progress Notes (Signed)
HPI: FU chest pain. Echocardiogram December 2016 showed normal LV systolic function, grade 2 diastolic dysfunction, mild left atrial enlargement, mild right ventricular enlargement. Cardiac catheterization November 2017 showed a 30% left main, 20% right coronary artery and 20% PDA. Monitor March 2018 normal. Apparently seen with recurrent chest pain in [facility] in [facility]. CTA showed no pulmonary embolus and enzymes negative. Since last seen, the patient denies any dyspnea on exertion, orthopnea, PND, pedal edema, palpitations, syncope or chest pain.   Current Outpatient Prescriptions  Medication Sig Dispense Refill  . aspirin 81 MG tablet Take 81 mg by mouth daily.     Marland Kitchen. atorvastatin (LIPITOR) 20 MG tablet Take 20 mg by mouth daily.   0  . celecoxib (CELEBREX) 200 MG capsule Take 200 mg by mouth daily.    . diclofenac sodium (VOLTAREN) 1 % GEL Apply 1 application topically daily as needed (for pain).   0  . ferrous sulfate 325 (65 FE) MG tablet Take 1 tablet (325 mg total) by mouth 2 (two) times daily with a meal. 60 tablet 3  . furosemide (LASIX) 40 MG tablet Take 40 mg by mouth 3 times/day as needed-between meals & bedtime.    . isosorbide mononitrate (IMDUR) 30 MG 24 hr tablet Take 1 tablet (30 mg total) by mouth daily. Needs follow up appointment for further refills 90 tablet 1  . metoprolol tartrate (LOPRESSOR) 25 MG tablet Take 25-50 mg by mouth See admin instructions. Take 50 mg by mouth in the morning and take 25 mg by mouth in the evening    . nitroGLYCERIN (NITROSTAT) 0.4 MG SL tablet Place 1 tablet (0.4 mg total) under the tongue every 5 (five) minutes x 3 doses as needed for chest pain. 25 tablet 12  . omeprazole (PRILOSEC) 20 MG capsule Take 20 mg by mouth daily.    Marland Kitchen. oxybutynin (DITROPAN XL) 15 MG 24 hr tablet Take 15 mg by mouth daily.    . potassium chloride (K-DUR,KLOR-CON) 10 MEQ tablet Take 10 mEq by mouth daily.    Marland Kitchen. scopolamine (TRANSDERM-SCOP) 1 MG/3DAYS  Place 1 patch onto the skin every 3 (three) days.    . SUMAtriptan (IMITREX) 25 MG tablet Take 25 mg by mouth every 2 (two) hours as needed for migraine. May repeat in 2 hours if headache persists or recurs.    . topiramate (TOPAMAX) 25 MG tablet Take 50 mg by mouth at bedtime.      No current facility-administered medications for this visit.      Past Medical History:  Diagnosis Date  . Anemia   . Arthritis   . CAD (coronary artery disease)    a. 08/2016: minimal, nonobstructive CAD by cath with 30% LM stenosis, 20% Mid-RCA stenosis, and 20% RPDA stenosis.   . Chest pain 09/13/2016  . Contact lens/glasses fitting    wears glasses or contacts  . Headache(784.0)   . Heart murmur   . Hyperlipemia   . Hypertension     Past Surgical History:  Procedure Laterality Date  . BUNIONECTOMY     both feet  . CARDIAC [place]HETERIZATION  2007   [place]  . CARDIAC CATHETERIZATION N/A 09/14/2016   Procedure: Left Heart Cath and Coronary Angiography;  Surgeon: Rinaldo CloudMohan Harwani, MD;  [facility];  Service: Cardiovascular;  Laterality: N/A;  . COLONOSCOPY    . HAMMERTOE RECONSTRUCTION WITH WEIL OSTEOTOMY  2006   rt foot  . HAMMERTOE RECONSTRUCTION WITH WEIL OSTEOTOMY  AND GASTROC SLIDE  09/26/2012   Procedure: HAMMERTOE RECONSTRUCTION WITH WEIL OSTEOTOMY AND GASTROC SLIDE;  Surgeon: Sherri Rad, MD;  Location: [facility];  Service: Orthopedics;  Laterality: Left;  LEFT GASTROC SLIDE, 2ND/3RD WEIL METATARSAL SHORTING OSTEOTOMIES, 2ND through 5th TOES MTP JOINT, DORSAL CAPSULOTOMY, HAMMER TOE RECONSTRUCTION,  FDL TO PROXIMAL PHALANX TENDON TRANSFER    Social History   Social History  . Marital status: Married    Spouse name: N/A  . Number of children: 2  . Years of education: N/A   Occupational History  . Not on file.   Social History Main Topics  . Smoking status: Never Smoker  . Smokeless tobacco: Never Used  . Alcohol use No  . Drug use: No  . Sexual  activity: Not on file   Other Topics Concern  . Not on file   Social History Narrative  . No narrative on file    Family History  Problem Relation Age of Onset  . Heart disease Mother        MI  . CAD Father   . CAD Brother   . CAD Brother     ROS: Leg cramping but no fevers or chills, productive cough, hemoptysis, dysphasia, odynophagia, melena, hematochezia, dysuria, hematuria, rash, seizure activity, orthopnea, PND, pedal edema, claudication. Remaining systems are negative.  Physical Exam: Well-developed well-nourished in no acute distress.  Skin is warm and dry.  HEENT is normal.  Neck is supple.  Chest is clear to auscultation with normal expansion.  Cardiovascular exam is regular rate and rhythm.  Abdominal exam nontender or distended. No masses palpated. Extremities show no edema. neuro grossly intact  ECG- sinus bradycardia at a rate of 58. No ST changes. personally reviewed  A/P  1 chest pain-previous cardiac evaluation negative including nonobstructive disease by cardiac catheterization. Her symptoms are markedly improved with isosorbide. We will continue.  2 hypertension-blood pressure is controlled. Continue present medications.  3 hyperlipidemia- Continue statin. Check lipids and liver.   4 dyspnea-Symptoms have resolved. No further evaluation at this point.   5 leg cramps-check potassium.  Olga Millers, MD

## 2017-08-24 ENCOUNTER — Ambulatory Visit (INDEPENDENT_AMBULATORY_CARE_PROVIDER_SITE_OTHER): Payer: Medicare HMO | Admitting: Cardiology

## 2017-08-24 ENCOUNTER — Encounter: Payer: Self-pay | Admitting: Cardiology

## 2017-08-24 VITALS — BP 126/78 | HR 58 | Ht 63.0 in | Wt 182.0 lb

## 2017-08-24 DIAGNOSIS — R0789 Other chest pain: Secondary | ICD-10-CM | POA: Diagnosis not present

## 2017-08-24 DIAGNOSIS — R002 Palpitations: Secondary | ICD-10-CM | POA: Diagnosis not present

## 2017-08-24 DIAGNOSIS — R0600 Dyspnea, unspecified: Secondary | ICD-10-CM | POA: Diagnosis not present

## 2017-08-24 DIAGNOSIS — E785 Hyperlipidemia, unspecified: Secondary | ICD-10-CM

## 2017-08-24 DIAGNOSIS — I10 Essential (primary) hypertension: Secondary | ICD-10-CM

## 2017-08-24 LAB — LIPID PANEL
Chol/HDL Ratio: 4.1 ratio (ref 0.0–4.4)
Cholesterol, Total: 263 mg/dL — ABNORMAL HIGH (ref 100–199)
HDL: 64 mg/dL (ref >39)
LDL Calculated: 181 mg/dL — ABNORMAL HIGH (ref 0–99)
Triglycerides: 89 mg/dL (ref 0–149)
VLDL Cholesterol Cal: 18 mg/dL (ref 5–40)

## 2017-08-24 LAB — HEPATIC FUNCTION PANEL
ALT: 10 IU/L (ref 0–32)
AST: 16 IU/L (ref 0–40)
Albumin: 4.4 g/dL (ref 3.6–4.8)
Alkaline Phosphatase: 117 IU/L (ref 39–117)
Bilirubin Total: 0.7 mg/dL (ref 0.0–1.2)
Bilirubin, Direct: 0.15 mg/dL (ref 0.00–0.40)
Total Protein: 7 g/dL (ref 6.0–8.5)

## 2017-08-24 LAB — BASIC METABOLIC PANEL
BUN/Creatinine Ratio: 17 (ref 12–28)
BUN: 14 mg/dL (ref 8–27)
CO2: 23 mmol/L (ref 20–29)
Calcium: 9 mg/dL (ref 8.7–10.3)
Chloride: 102 mmol/L (ref 96–106)
Creatinine, Ser: 0.81 mg/dL (ref 0.57–1.00)
GFR calc Af Amer: 89 mL/min/{1.73_m2} (ref >59)
GFR calc non Af Amer: 77 mL/min/{1.73_m2} (ref >59)
Glucose: 103 mg/dL — ABNORMAL HIGH (ref 65–99)
Potassium: 4.7 mmol/L (ref 3.5–5.2)
Sodium: 140 mmol/L (ref 134–144)

## 2017-08-24 MED ORDER — ISOSORBIDE MONONITRATE ER 30 MG PO TB24: 30.0000 mg | ORAL_TABLET | Freq: Every day | ORAL | 3 refills | Status: DC

## 2017-08-24 MED ORDER — NITROGLYCERIN 0.4 MG SL SUBL: 0.4000 mg | SUBLINGUAL_TABLET | SUBLINGUAL | 12 refills | Status: DC | PRN

## 2017-08-24 MED ORDER — FUROSEMIDE 40 MG PO TABS: 40.0000 mg | ORAL_TABLET | Freq: Two times a day (BID) | ORAL | 3 refills | Status: DC | PRN

## 2017-08-24 MED ORDER — ATORVASTATIN CALCIUM 20 MG PO TABS
20.0000 mg | ORAL_TABLET | Freq: Every day | ORAL | 3 refills | Status: DC
Start: 2017-08-24 — End: 2017-08-25

## 2017-08-24 MED ORDER — METOPROLOL TARTRATE 25 MG PO TABS: 25.0000 mg | ORAL_TABLET | ORAL | 3 refills | Status: DC

## 2017-08-24 MED ORDER — POTASSIUM CHLORIDE CRYS ER 10 MEQ PO TBCR: 10.0000 meq | EXTENDED_RELEASE_TABLET | Freq: Every day | ORAL | 3 refills | Status: AC

## 2017-08-24 NOTE — Patient Instructions (Signed)
Medication Instructions:   NO CHANGE  Labwork:  Your physician recommends that you HAVE LAB WORK TODAY  Follow-Up:  Your physician wants you to follow-up in: ONE YEAR WITH DR CRENSHAW You will receive a reminder letter in the mail two months in advance. If you don't receive a letter, please call our office to schedule the follow-up appointment.   If you need a refill on your cardiac medications before your next appointment, please call your pharmacy.    

## 2017-08-25 ENCOUNTER — Telehealth: Payer: Self-pay | Admitting: *Deleted

## 2017-08-25 DIAGNOSIS — E7800 Pure hypercholesterolemia, unspecified: Secondary | ICD-10-CM

## 2017-08-25 MED ORDER — ATORVASTATIN CALCIUM 80 MG PO TABS: 80.0000 mg | ORAL_TABLET | Freq: Every day | ORAL | 3 refills | Status: DC

## 2017-08-25 NOTE — Telephone Encounter (Signed)
Spoke with pt, aware of medication change. New script sent to the pharmacy and Lab orders mailed to the pt  

## 2017-08-25 NOTE — Telephone Encounter (Addendum)
-----   Message from Lewayne BuntingBrian S Crenshaw, MD sent at 08/24/2017  4:46 PM EDT ----- Change lipitor to 80 mg daily; check lipids and liver in 4 weeks Olga MillersBrian Crenshaw   Left message for pt to call

## 2017-09-04 ENCOUNTER — Telehealth: Payer: Self-pay | Admitting: Cardiology

## 2017-09-04 NOTE — Telephone Encounter (Signed)
New Message     Pt needs to know about the prescription that dr Jens Somcrenshaw as calling into [facility]     Isosorbide: [facility] said they never received it

## 2017-09-04 NOTE — Telephone Encounter (Signed)
Patient stated that she did get her prescription. No further assistance is needed.

## 2018-09-21 ENCOUNTER — Other Ambulatory Visit: Payer: Self-pay | Admitting: Cardiology

## 2018-09-21 ENCOUNTER — Other Ambulatory Visit: Payer: Self-pay | Admitting: Nurse Practitioner

## 2018-09-21 MED ORDER — ISOSORBIDE MONONITRATE ER 30 MG PO TB24: 30.0000 mg | ORAL_TABLET | Freq: Every day | ORAL | 3 refills | Status: DC

## 2019-04-17 ENCOUNTER — Other Ambulatory Visit: Payer: Self-pay | Admitting: Cardiology

## 2019-05-17 ENCOUNTER — Telehealth: Payer: Self-pay | Admitting: Cardiology

## 2019-05-17 NOTE — Telephone Encounter (Signed)

## 2019-05-20 ENCOUNTER — Ambulatory Visit (INDEPENDENT_AMBULATORY_CARE_PROVIDER_SITE_OTHER): Payer: Medicare HMO | Admitting: Cardiology

## 2019-05-20 ENCOUNTER — Other Ambulatory Visit: Payer: Self-pay

## 2019-05-20 VITALS — BP 163/84 | HR 64 | Temp 97.5°F | Ht 63.0 in | Wt 198.0 lb

## 2019-05-20 DIAGNOSIS — I5189 Other ill-defined heart diseases: Secondary | ICD-10-CM | POA: Diagnosis not present

## 2019-05-20 DIAGNOSIS — I10 Essential (primary) hypertension: Secondary | ICD-10-CM

## 2019-05-20 DIAGNOSIS — I2510 Atherosclerotic heart disease of native coronary artery without angina pectoris: Secondary | ICD-10-CM

## 2019-05-20 DIAGNOSIS — R0609 Other forms of dyspnea: Secondary | ICD-10-CM | POA: Diagnosis not present

## 2019-05-20 DIAGNOSIS — E7801 Familial hypercholesterolemia: Secondary | ICD-10-CM

## 2019-05-20 DIAGNOSIS — Z8669 Personal history of other diseases of the nervous system and sense organs: Secondary | ICD-10-CM

## 2019-05-20 DIAGNOSIS — R0600 Dyspnea, unspecified: Secondary | ICD-10-CM

## 2019-05-20 DIAGNOSIS — Z8249 Family history of ischemic heart disease and other diseases of the circulatory system: Secondary | ICD-10-CM | POA: Insufficient documentation

## 2019-05-20 DIAGNOSIS — R079 Chest pain, unspecified: Secondary | ICD-10-CM

## 2019-05-20 MED ORDER — ISOSORBIDE MONONITRATE ER 30 MG PO TB24: 30.0000 mg | ORAL_TABLET | Freq: Every day | ORAL | 3 refills | Status: DC

## 2019-05-20 NOTE — Assessment & Plan Note (Addendum)
Normal coronaries in 2002, at [facility] 2007, and minor CAD at cath at [facility] Nov 2017

## 2019-05-20 NOTE — Assessment & Plan Note (Signed)
On Imitrex and Topamax

## 2019-05-20 NOTE — Assessment & Plan Note (Signed)
Statin recently resumed- goal LDL less than 70

## 2019-05-20 NOTE — Assessment & Plan Note (Signed)
Father had CABG in his 54's Mother had an MI in her 25's 2 brothers with early CAD Sister with SD in her 59's

## 2019-05-20 NOTE — Assessment & Plan Note (Signed)
Possibly secondary to small vessel CAD- nitrate responsive

## 2019-05-20 NOTE — Progress Notes (Signed)
Cardiology Office Note:    Date:  05/20/2019   ID:  Brandi Russell, DOB [birth year], MRN [id number]  PCP:  Hubbard Robinson, MD  Cardiologist:  Kirk Ruths, MD  Electrophysiologist:  None   Referring MD: Hubbard Robinson, MD   DOE  History of Present Illness:    Brandi Russell is a pleasant 68 y.o.AA female with a hx of hypertension, dyslipidemia, migraines, a strong FM Hx of CAD, and a past history of chest pain responsive to Imdur.  She was evaluated for chest pain in 2002 (normal cath), at [facility] in 2007 (admitted then with chest pain and had a normal cath), and again in November 2017 -cath showed a 20% mid RCA, 20% distal RCA, and a 30% proximal LAD lesion.  Echocardiogram in 2016 showed preserved LV function with grade 2 diastolic dysfunction.  Blood pressure control, weight loss and statin therapy was recommended.  Dr. Stanford Breed had put her on Lipitor 80 mg then for an LDL of 187.  When the prescription ran out she stopped taking it.  We have not seen her since November 2018.  She is followed by her PCP who recently had her resume her statin therapy.  Labs and EKG were all done at her primary care's office and I did not repeat those today.  I will obtain those results though.  The patient presents to the office with complaints of some mild dyspnea on exertion and swelling in her legs at the end of the day.  Her primary care provider is added torsemide 20 mg daily as needed as needed dose.  Patient says her blood pressure at home runs in the 798 systolic over 80 range.  It was a little higher than that in the office today, 152/82 by me.  She denies any chest pain as long as she takes her Imdur.  She denies any orthopnea.  She feels like she is gained weight since the COVID pandemic started.  She is going to the obesity clinic at [facility].  Past Medical History:  Diagnosis Date  . Anemia   . Arthritis   . CAD (coronary artery disease)    a. 08/2016: minimal, nonobstructive CAD by  cath with 30% LM stenosis, 20% Mid-RCA stenosis, and 20% RPDA stenosis.   . Chest pain 09/13/2016  . Contact lens/glasses fitting    wears glasses or contacts  . Headache(784.0)   . Heart murmur   . Hyperlipemia   . Hypertension     Past Surgical History:  Procedure Laterality Date  . BUNIONECTOMY     both feet  . CARDIAC CATHETERIZATION  2007   [place]  . CARDIAC CATHETERIZATION N/A 09/14/2016   Procedure: Left Heart Cath and Coronary Angiography;  Surgeon: Charolette Forward, MD;  Location: [place] [facility];  Service: Cardiovascular;  Laterality: N/A;  . COLONOSCOPY    . HAMMERTOE RECONSTRUCTION WITH WEIL OSTEOTOMY  2006   rt foot  . HAMMERTOE RECONSTRUCTION WITH WEIL OSTEOTOMY AND GASTROC SLIDE  09/26/2012   Procedure: HAMMERTOE RECONSTRUCTION WITH WEIL OSTEOTOMY AND GASTROC SLIDE;  Surgeon: Colin Rhein, MD;  Location: [place];  Service: Orthopedics;  Laterality: Left;  LEFT GASTROC SLIDE, 2ND/3RD WEIL METATARSAL SHORTING OSTEOTOMIES, 2ND through 5th TOES MTP JOINT, DORSAL CAPSULOTOMY, HAMMER TOE RECONSTRUCTION,  FDL TO PROXIMAL PHALANX TENDON TRANSFER    Current Medications: Current Meds  Medication Sig  . aspirin 81 MG tablet Take 81 mg by mouth daily.   Marland Kitchen atorvastatin (LIPITOR) 80 MG tablet  Take 1 tablet (80 mg total) by mouth daily.  . celecoxib (CELEBREX) 200 MG capsule Take 200 mg by mouth daily.  . isosorbide mononitrate (IMDUR) 30 MG 24 hr tablet Take 1 tablet (30 mg total) by mouth daily.  . metoprolol tartrate (LOPRESSOR) 25 MG tablet TAKE 2 TABLETS BY MOUTH IN THE MORNING AND TAKE 1 TABLET IN THE EVENING  . nitroGLYCERIN (NITROSTAT) 0.4 MG SL tablet DISSOLVE ONE TABLET UNDER THE TONGUE EVERY 5 MINUTES AS NEEDED FOR CHEST PAIN.  DO NOT EXCEED A TOTAL OF 3 DOSES IN 15 MINUTES  . omeprazole (PRILOSEC) 20 MG capsule Take 20 mg by mouth daily.  Marland Kitchen. oxybutynin (DITROPAN XL) 15 MG 24 hr tablet Take 15 mg by mouth daily.  . potassium chloride (K-DUR,KLOR-CON)  10 MEQ tablet Take 1 tablet (10 mEq total) by mouth daily.  Marland Kitchen. scopolamine (TRANSDERM-SCOP) 1 MG/3DAYS Place 1 patch onto the skin every 3 (three) days.  . SUMAtriptan (IMITREX) 25 MG tablet Take 25 mg by mouth every 2 (two) hours as needed for migraine. May repeat in 2 hours if headache persists or recurs.  . topiramate (TOPAMAX) 25 MG tablet Take 50 mg by mouth at bedtime.   . torsemide (DEMADEX) 20 MG tablet Take 20 mg by mouth daily.  . [DISCONTINUED] diclofenac sodium (VOLTAREN) 1 % GEL Apply 1 application topically daily as needed (for pain).   . [DISCONTINUED] ferrous sulfate 325 (65 FE) MG tablet Take 1 tablet (325 mg total) by mouth 2 (two) times daily with a meal.  . [DISCONTINUED] furosemide (LASIX) 40 MG tablet Take 1 tablet (40 mg total) by mouth 3 times/day as needed-between meals & bedtime.  . [DISCONTINUED] isosorbide mononitrate (IMDUR) 30 MG 24 hr tablet Take 1 tablet (30 mg total) by mouth daily.     Allergies:   Patient has no known allergies.   Social History   Socioeconomic History  . Marital status: Married    Spouse name: Not on file  . Number of children: 2  . Years of education: Not on file  . Highest education level: Not on file  Occupational History  . Not on file  Social Needs  . Financial resource strain: Not on file  . Food insecurity    Worry: Not on file    Inability: Not on file  . Transportation needs    Medical: Not on file    Non-medical: Not on file  Tobacco Use  . Smoking status: Never Smoker  . Smokeless tobacco: Never Used  Substance and Sexual Activity  . Alcohol use: No  . Drug use: No  . Sexual activity: Not on file  Lifestyle  . Physical activity    Days per week: Not on file    Minutes per session: Not on file  . Stress: Not on file  Relationships  . Social Musicianconnections    Talks on phone: Not on file    Gets together: Not on file    Attends religious service: Not on file    Active member of club or organization: Not on file     Attends meetings of clubs or organizations: Not on file    Relationship status: Not on file  Other Topics Concern  . Not on file  Social History Narrative  . Not on file     Family History: The patient's family history includes CAD in her brother, brother, and father; Heart disease in her mother.  ROS:   Please see the history of present illness.  All other systems reviewed and are negative.  EKGs/Labs/Other Studies Reviewed:    The following studies were reviewed today: Cath 2017   Recent Labs: No results found for requested labs within last 8760 hours.  Recent Lipid Panel    Component Value Date/Time   CHOL 263 (H) 08/24/2017 1124   TRIG 89 08/24/2017 1124   HDL 64 08/24/2017 1124   CHOLHDL 4.1 08/24/2017 1124   CHOLHDL 3.5 09/14/2016 0533   VLDL 10 09/14/2016 0533   LDLCALC 181 (H) 08/24/2017 1124    Physical Exam:    VS:  BP (!) 163/84   Pulse 64   Temp (!) 97.5 F (36.4 C)   Ht 5\' 3"  (1.6 m)   Wt 198 lb (89.8 kg)   SpO2 96%   BMI 35.07 kg/m     Wt Readings from Last 3 Encounters:  05/20/19 198 lb (89.8 kg)  08/24/17 182 lb (82.6 kg)  11/08/16 170 lb (77.1 kg)     GEN:  Well nourished, well developed in no acute distress HEENT: Normal NECK: No JVD; No carotid bruits LYMPHATICS: No lymphadenopathy CARDIAC: RRR, no murmurs, rubs, gallops RESPIRATORY:  Clear to auscultation without rales, wheezing or rhonchi  ABDOMEN: Soft, non-tender, non-distended MUSCULOSKELETAL:  No edema; No deformity  SKIN: Warm and dry NEUROLOGIC:  Alert and oriented x 3 PSYCHIATRIC:  Normal affect   ASSESSMENT:    Dyspnea Pt c/o DOE and LE edema  Diastolic dysfunction Grade 2 DD on echo 2016  Essential hypertension 152/82 by me- she tells me it runs 120/80 at home  Hyperlipidemia Statin recently resumed- goal LDL less than 70  CAD (coronary artery disease), native coronary artery Normal coronaries in 2002, at [facility] 2007, and minor CAD [facility]  Chest pain Possibly secondary to small vessel CAD- nitrate responsive  History of migraine On Imitrex and Topamax  Family history of coronary artery disease Father had CABG in his 7050's Mother had an MI in her 3150's 2 brothers with early CAD Sister with SD in her 7330's  PLAN:    I told her I would review her EKG and recent labs from her PCP.  I told her it would be our recommendation that she have a goal of less than 70 for her LDL and a systolic B/P less than 130 consistently. She tells me that her B/P at home usually runs 120/80 and I asked her to keep a log for us.  If her B/P is running high we could change her torsemide to daily HCTZ or consider the addition of an ARB.  She does c/o of DOE and I will repeat her echo.  She tells me her chest pain is under control with Imdur- she may have small vessel disease. Keep follow up with Dr Jens Somrenshaw in Oct as scheduled.  She also tells me she has been "pushing fluids" drinking at least 8 glasses of water a day.  I suggested with her B/P issues and h/o edema and DOE she only needs to drink if she feels thirsty and advised her to not push fluids and avoid salt.   Medication Adjustments/Labs and Tests Ordered: Current medicines are reviewed at length with the patient today.  Concerns regarding medicines are outlined above.  Orders Placed This Encounter  Procedures  . ECHOCARDIOGRAM COMPLETE   Meds ordered this encounter  Medications  . isosorbide mononitrate (IMDUR) 30 MG 24 hr tablet    Sig: Take 1 tablet (30 mg total) by  mouth daily.    Dispense:  90 tablet    Refill:  3    Patient Instructions  Medication Instructions:  Your physician recommends that you continue on your current medications as directed. Please refer to the Current Medication list given to you today. If you need a refill on your cardiac medications before your next appointment, please call your pharmacy.   Lab work: None  If you have labs (blood work) drawn  today and your tests are completely normal, you will receive your results only by: Marland Kitchen. MyChart Message (if you have MyChart) OR . A paper copy in the mail If you have any lab test that is abnormal or we need to change your treatment, we will call you to review the results.  Testing/Procedures: Your physician has requested that you have an echocardiogram. Echocardiography is a painless test that uses sound waves to create images of your heart. It provides your doctor with information about the size and shape of your heart and how well your heart's chambers and valves are working. This procedure takes approximately one hour. There are no restrictions for this procedure. [address]   Follow-Up: At [facility], you and your health needs are our priority.  As part of our continuing mission to provide you with exceptional heart care, we have created designated Provider Care Teams.  These Care Teams include your primary Cardiologist (physician) and Advanced Practice Providers (APPs -  Physician Assistants and Nurse Practitioners) who all work together to provide you with the care you need, when you need it. . FOLLOW UP AS SCHEDULED WITH DR CRENSHAW   Any Other Special Instructions Will Be Listed Below (If Applicable).      Jolene ProvostSigned, Deniz Eskridge, PA-C  05/20/2019 10:09 AM    [facility]

## 2019-05-20 NOTE — Patient Instructions (Signed)
Medication Instructions:  Your physician recommends that you continue on your current medications as directed. Please refer to the Current Medication list given to you today. If you need a refill on your cardiac medications before your next appointment, please call your pharmacy.   Lab work: None  If you have labs (blood work) drawn today and your tests are completely normal, you will receive your results only by: Marland Kitchen MyChart Message (if you have MyChart) OR . A paper copy in the mail If you have any lab test that is abnormal or we need to change your treatment, we will call you to review the results.  Testing/Procedures: Your physician has requested that you have an echocardiogram. Echocardiography is a painless test that uses sound waves to create images of your heart. It provides your doctor with information about the size and shape of your heart and how well your heart's chambers and valves are working. This procedure takes approximately one hour. There are no restrictions for this procedure. [place]   Follow-Up: At [facility], you and your health needs are our priority.  As part of our continuing mission to provide you with exceptional heart care, we have created designated Provider Care Teams.  These Care Teams include your primary Cardiologist (physician) and Advanced Practice Providers (APPs -  Physician Assistants and Nurse Practitioners) who all work together to provide you with the care you need, when you need it. . FOLLOW UP AS SCHEDULED WITH DR CRENSHAW   Any Other Special Instructions Will Be Listed Below (If Applicable).

## 2019-05-20 NOTE — Assessment & Plan Note (Signed)
Grade 2 DD on echo 2016

## 2019-05-20 NOTE — Assessment & Plan Note (Addendum)
Pt c/o DOE and LE edema

## 2019-05-20 NOTE — Assessment & Plan Note (Signed)
152/82 by me- she tells me it runs 120/80 at home

## 2019-05-28 ENCOUNTER — Other Ambulatory Visit (HOSPITAL_COMMUNITY): Payer: Medicare HMO

## 2019-05-29 ENCOUNTER — Ambulatory Visit (HOSPITAL_COMMUNITY): Payer: Medicare HMO | Attending: Cardiology

## 2019-05-29 ENCOUNTER — Other Ambulatory Visit: Payer: Self-pay

## 2019-05-29 DIAGNOSIS — R0609 Other forms of dyspnea: Secondary | ICD-10-CM | POA: Diagnosis not present

## 2019-05-31 ENCOUNTER — Telehealth: Payer: Self-pay | Admitting: Cardiology

## 2019-05-31 DIAGNOSIS — I10 Essential (primary) hypertension: Secondary | ICD-10-CM

## 2019-05-31 MED ORDER — VALSARTAN 80 MG PO TABS: 80.0000 mg | ORAL_TABLET | Freq: Every day | ORAL | 5 refills | Status: DC

## 2019-05-31 NOTE — Telephone Encounter (Signed)
New Message   Pt c/o BP issue:  1. What are your last 5 BP readings? 154/97 (this morning), 155/85 (yesterday) 2. Are you having any other symptoms (ex. Dizziness, headache, blurred vision, passed out)? dizziness 3. What is your medication issue? None   Patient states that when she had her echo her BP was 184/105. Last week her BP 164/84. She states that she has spoken with her PCP and they advised her to contact cardiology.

## 2019-05-31 NOTE — Telephone Encounter (Signed)
Spoke with patient - she notes that her dizziness is ongoing and started about 2 weeks ago when she first noted her BP was increasing.  She denies and lifestyle/eating changes specific to the last few weeks.    Takes metoprolol tart 50 mg qhs and torsemide 20 mg prn.  She endorses occasional LEE, not worse at any given time of day.   Uses torsemide about twice weekly.  Labs from 7/1 show Na 137, K 4.5, BUN 18, SCr 1.21.  CrCl was 64 with weight of 89.8 kg.    Reviewed blood pressure concerns with patient.  She will start valsartan 80 mg once daily and continue with regular home BP checks.   Encouraged her to exercise (she has treadmill and elliptical), working her way up to 30 minutes daily.  She would like to make lifestyle changes rather than take meds, but I explained that sometimes both are needed.    Repeat BMET in 10-14 days, lab order mailed to patient.    Patient voiced understanding with plan.

## 2019-05-31 NOTE — Telephone Encounter (Signed)
Patient states that for the past 2 weeks that she has had issues with her BP. Patient took it this morning 155/97, did not get HR at this time.  Patient takes metoprolol 25 mg taking twice at night.  Patient denies chest pain, SOB; and does mention having swelling and takes medication for this.  Patient states she has been dizzy, with whatever she is doing.  Patient denies changes diet- no extra salt at all. Please advise on changes?

## 2019-06-03 ENCOUNTER — Telehealth: Payer: Self-pay | Admitting: Cardiology

## 2019-06-03 NOTE — Telephone Encounter (Signed)
New message:    Patient calling stating she is on some new medication she has some questions and concerns. Patient also states that her husband could not wake her up last night and she was sleept for 12 hours. Please call patient.

## 2019-06-03 NOTE — Telephone Encounter (Signed)
No BP readings provided.  Patient instructed to cut valsartan dose in 1/2 for now. She is to take valsartan at bedtime as well and continue to monitor BP daily.

## 2019-06-03 NOTE — Telephone Encounter (Signed)
Spoke with pt, she was started on valsartan and she took the first dose yesterday morning and went to sleep at 7 pm and slept 12 hours. Her husband was not able to get her awake. They are wondering if due to the new medication. Valsartan was started by the pharm md, will forward for their review.

## 2019-08-19 ENCOUNTER — Ambulatory Visit: Payer: Medicare HMO | Admitting: Cardiology

## 2019-09-02 NOTE — Progress Notes (Signed)
HPI: FU chest pain. Cardiac catheterization November 2017 showed a 30% left main, 20% right coronary artery and 20% PDA. Monitor March 2018 normal. Apparently seen with recurrent chest pain in the [facility] ER in January 2018. CTA showed no pulmonary embolus and enzymes negative. Echo August 2020 showed ejection fraction 60 to 65%, mild to moderate mitral regurgitation.  Since last seen,  she has some dyspnea on exertion but no orthopnea or PND.  Occasional mild pedal edema.  No exertional chest pain or syncope.  Current Outpatient Medications  Medication Sig Dispense Refill  . aspirin 81 MG tablet Take 81 mg by mouth daily.     Marland Kitchen atorvastatin (LIPITOR) 80 MG tablet Take 1 tablet (80 mg total) by mouth daily. 90 tablet 3  . celecoxib (CELEBREX) 200 MG capsule Take 200 mg by mouth daily.    . isosorbide mononitrate (IMDUR) 30 MG 24 hr tablet Take 1 tablet (30 mg total) by mouth daily. 90 tablet 3  . metoprolol tartrate (LOPRESSOR) 25 MG tablet TAKE 2 TABLETS BY MOUTH IN THE MORNING AND TAKE 1 TABLET IN THE EVENING 90 tablet 0  . nitroGLYCERIN (NITROSTAT) 0.4 MG SL tablet DISSOLVE ONE TABLET UNDER THE TONGUE EVERY 5 MINUTES AS NEEDED FOR CHEST PAIN.  DO NOT EXCEED A TOTAL OF 3 DOSES IN 15 MINUTES 25 tablet 0  . omeprazole (PRILOSEC) 20 MG capsule Take 20 mg by mouth daily.    Marland Kitchen oxybutynin (DITROPAN XL) 15 MG 24 hr tablet Take 15 mg by mouth daily.    . potassium chloride (K-DUR,KLOR-CON) 10 MEQ tablet Take 1 tablet (10 mEq total) by mouth daily. (Patient taking differently: Take 10 mEq by mouth daily as needed. ) 90 tablet 3  . scopolamine (TRANSDERM-SCOP) 1 MG/3DAYS Place 1 patch onto the skin every 3 (three) days.    . SUMAtriptan (IMITREX) 25 MG tablet Take 25 mg by mouth every 2 (two) hours as needed for migraine. May repeat in 2 hours if headache persists or recurs.    . topiramate (TOPAMAX) 25 MG tablet Take 50 mg by mouth at bedtime.     . torsemide (DEMADEX) 20 MG tablet Take 20 mg by  mouth daily as needed.     . valsartan (DIOVAN) 80 MG tablet Take 1 tablet (80 mg total) by mouth daily. (Patient taking differently: Take 80 mg by mouth daily. Patient cuts the tablets in 1/3) 30 tablet 5   No current facility-administered medications for this visit.      Past Medical History:  Diagnosis Date  . Anemia   . Arthritis   . CAD (coronary artery disease)    a. 08/2016: minimal, nonobstructive CAD by cath with 30% LM stenosis, 20% Mid-RCA stenosis, and 20% RPDA stenosis.   . Chest pain 09/13/2016  . Contact lens/glasses fitting    wears glasses or contacts  . Headache(784.0)   . Heart murmur   . Hyperlipemia   . Hypertension     Past Surgical History:  Procedure Laterality Date  . BUNIONECTOMY     both feet  . CARDIAC CATHETERIZATION  2007   [place]  . CARDIAC CATHETERIZATION N/A 09/14/2016   Procedure: Left Heart Cath and Coronary Angiography;  Surgeon: Charolette Forward, MD;  Location: [place] [facility];  Service: Cardiovascular;  Laterality: N/A;  . COLONOSCOPY    . HAMMERTOE RECONSTRUCTION WITH WEIL OSTEOTOMY  2006   rt foot  . HAMMERTOE RECONSTRUCTION WITH WEIL OSTEOTOMY AND GASTROC SLIDE  09/26/2012  Procedure: HAMMERTOE RECONSTRUCTION WITH WEIL OSTEOTOMY AND GASTROC SLIDE;  Surgeon: Sherri Rad, MD;  Location: [facility];  Service: Orthopedics;  Laterality: Left;  LEFT GASTROC SLIDE, 2ND/3RD WEIL METATARSAL SHORTING OSTEOTOMIES, 2ND through 5th TOES MTP JOINT, DORSAL CAPSULOTOMY, HAMMER TOE RECONSTRUCTION,  FDL TO PROXIMAL PHALANX TENDON TRANSFER    Social History   Socioeconomic History  . Marital status: Married    Spouse name: Not on file  . Number of children: 2  . Years of education: Not on file  . Highest education level: Not on file  Occupational History  . Not on file  Social Needs  . Financial resource strain: Not on file  . Food insecurity    Worry: Not on file    Inability: Not on file  . Transportation needs     Medical: Not on file    Non-medical: Not on file  Tobacco Use  . Smoking status: Never Smoker  . Smokeless tobacco: Never Used  Substance and Sexual Activity  . Alcohol use: No  . Drug use: No  . Sexual activity: Not on file  Lifestyle  . Physical activity    Days per week: Not on file    Minutes per session: Not on file  . Stress: Not on file  Relationships  . Social Musician on phone: Not on file    Gets together: Not on file    Attends religious service: Not on file    Active member of club or organization: Not on file    Attends meetings of clubs or organizations: Not on file    Relationship status: Not on file  . Intimate partner violence    Fear of current or ex partner: Not on file    Emotionally abused: Not on file    Physically abused: Not on file    Forced sexual activity: Not on file  Other Topics Concern  . Not on file  Social History Narrative  . Not on file    Family History  Problem Relation Age of Onset  . Heart disease Mother        MI  . CAD Father   . CAD Brother   . CAD Brother     ROS: no fevers or chills, productive cough, hemoptysis, dysphasia, odynophagia, melena, hematochezia, dysuria, hematuria, rash, seizure activity, orthopnea, PND, pedal edema, claudication. Remaining systems are negative.  Physical Exam: Well-developed well-nourished in no acute distress.  Skin is warm and dry.  HEENT is normal.  Neck is supple.  Chest is clear to auscultation with normal expansion.  Cardiovascular exam is regular rate and rhythm.  Abdominal exam nontender or distended. No masses palpated. Extremities show no edema. neuro grossly intact  ECG-sinus rhythm at a rate of 63, left ventricular hypertrophy.  Personally reviewed  A/P  1 chest pain-no recent symptoms.  Previous evaluation unremarkable.  2 hypertension-blood pressure elevated; add hydrochlorothiazide 25 mg daily.  Check potassium and renal function in 1 week.  3  hyperlipidemia-continue statin.  4 dyspnea-some dyspnea on exertion.  Previous ischemia evaluation negative and echocardiogram showed normal LV function.  5 Mild to moderate MR-Will need fu echo in the future.  Olga Millers, MD

## 2019-09-09 ENCOUNTER — Other Ambulatory Visit: Payer: Self-pay

## 2019-09-09 ENCOUNTER — Encounter: Payer: Self-pay | Admitting: Cardiology

## 2019-09-09 ENCOUNTER — Ambulatory Visit (INDEPENDENT_AMBULATORY_CARE_PROVIDER_SITE_OTHER): Payer: Medicare HMO | Admitting: Cardiology

## 2019-09-09 VITALS — BP 150/80 | HR 63 | Ht 63.0 in | Wt 200.8 lb

## 2019-09-09 DIAGNOSIS — R0600 Dyspnea, unspecified: Secondary | ICD-10-CM

## 2019-09-09 DIAGNOSIS — R079 Chest pain, unspecified: Secondary | ICD-10-CM

## 2019-09-09 DIAGNOSIS — I10 Essential (primary) hypertension: Secondary | ICD-10-CM | POA: Diagnosis not present

## 2019-09-09 DIAGNOSIS — R0609 Other forms of dyspnea: Secondary | ICD-10-CM

## 2019-09-09 DIAGNOSIS — E7801 Familial hypercholesterolemia: Secondary | ICD-10-CM | POA: Diagnosis not present

## 2019-09-09 MED ORDER — HYDROCHLOROTHIAZIDE 25 MG PO TABS: 25.0000 mg | ORAL_TABLET | Freq: Every day | ORAL | 3 refills | Status: DC

## 2019-09-09 NOTE — Patient Instructions (Signed)
Medication Instructions:  START HYDROCHLOROTHIAZIDE 25 MG ONCE DAILY   *If you need a refill on your cardiac medications before your next appointment, please call your pharmacy*  Lab Work: Your physician recommends that you return for lab work in: [place]  If you have labs (blood work) drawn today and your tests are completely normal, you will receive your results only by: Marland Kitchen MyChart Message (if you have MyChart) OR . A paper copy in the mail If you have any lab test that is abnormal or we need to change your treatment, we will call you to review the results.  Follow-Up: At [facility], you and your health needs are our priority.  As part of our continuing mission to provide you with exceptional heart care, we have created designated Provider Care Teams.  These Care Teams include your primary Cardiologist (physician) and Advanced Practice Providers (APPs -  Physician Assistants and Nurse Practitioners) who all work together to provide you with the care you need, when you need it.  Your next appointment:   12 months  The format for your next appointment:   Either In Person or Virtual  Provider:   You may see Kirk Ruths, MD or one of the following Advanced Practice Providers on your designated Care Team:    Kerin Ransom, PA-C  [place], [place]  Coletta Memos, [place]

## 2019-09-18 ENCOUNTER — Other Ambulatory Visit: Payer: Self-pay

## 2019-09-18 DIAGNOSIS — Z20822 Contact with and (suspected) exposure to covid-19: Secondary | ICD-10-CM

## 2019-09-20 LAB — NOVEL CORONAVIRUS, NAA: SARS-CoV-2, NAA: NOT DETECTED

## 2019-10-01 ENCOUNTER — Telehealth: Payer: Self-pay | Admitting: Cardiology

## 2019-10-01 NOTE — Telephone Encounter (Signed)
Follow Up  Patient states that she found the order for the lab work in her paperwork, so please disregard sending new order.

## 2019-10-01 NOTE — Telephone Encounter (Signed)
Recording received Unable to leave message Will try later ./cy

## 2019-10-01 NOTE — Telephone Encounter (Signed)
Patient is requesting an order for her lab work. She would like it mailed to her. Please contact her when it is mailed.

## 2019-10-11 ENCOUNTER — Ambulatory Visit: Payer: Medicare HMO | Attending: Internal Medicine

## 2019-10-11 ENCOUNTER — Other Ambulatory Visit: Payer: Self-pay

## 2019-10-11 DIAGNOSIS — Z20822 Contact with and (suspected) exposure to covid-19: Secondary | ICD-10-CM

## 2019-10-13 LAB — NOVEL CORONAVIRUS, NAA: SARS-CoV-2, NAA: NOT DETECTED

## 2019-11-13 ENCOUNTER — Ambulatory Visit: Payer: Medicare Other | Attending: Internal Medicine

## 2019-11-13 DIAGNOSIS — Z23 Encounter for immunization: Secondary | ICD-10-CM | POA: Insufficient documentation

## 2019-11-13 NOTE — Progress Notes (Signed)
   Covid-19 Vaccination Clinic  Name:  Brandi Russell    MRN: [id number] DOB: 06-04-1951  11/13/2019  Brandi Russell was observed post Covid-19 immunization for 15 minutes without incidence. She was provided with Vaccine Information Sheet and instruction to access the V-Safe system.   Brandi Russell was instructed to call 911 with any severe reactions post vaccine: Marland Kitchen Difficulty breathing  . Swelling of your face and throat  . A fast heartbeat  . A bad rash all over your body  . Dizziness and weakness    Immunizations Administered    Name Date Dose VIS Date Route   Pfizer COVID-19 Vaccine 11/13/2019  2:36 PM 0.3 mL 10/04/2019 Intramuscular   Manufacturer: [employer], [employer]   Lot: [id number]   NDC: [address]-7

## 2019-12-03 ENCOUNTER — Other Ambulatory Visit: Payer: Self-pay | Admitting: *Deleted

## 2019-12-03 MED ORDER — ISOSORBIDE MONONITRATE ER 30 MG PO TB24: 30.0000 mg | ORAL_TABLET | Freq: Every day | ORAL | 1 refills | Status: DC

## 2019-12-03 NOTE — Telephone Encounter (Signed)
Rx has been sent to the pharmacy electronically. ° °

## 2019-12-04 ENCOUNTER — Ambulatory Visit: Payer: Medicare HMO | Attending: Internal Medicine

## 2019-12-04 ENCOUNTER — Other Ambulatory Visit: Payer: Medicare HMO

## 2019-12-04 DIAGNOSIS — Z20822 Contact with and (suspected) exposure to covid-19: Secondary | ICD-10-CM

## 2019-12-04 DIAGNOSIS — Z23 Encounter for immunization: Secondary | ICD-10-CM | POA: Insufficient documentation

## 2019-12-04 NOTE — Progress Notes (Signed)
   Covid-19 Vaccination Clinic  Name:  Brandi Russell    MRN: [id number] DOB: 04/27/1951  12/04/2019  Brandi Russell was observed post Covid-19 immunization for 15 minutes without incidence. She was provided with Vaccine Information Sheet and instruction to access the V-Safe system.   Brandi Russell was instructed to call 911 with any severe reactions post vaccine: Marland Kitchen Difficulty breathing  . Swelling of your face and throat  . A fast heartbeat  . A bad rash all over your body  . Dizziness and weakness    Immunizations Administered    Name Date Dose VIS Date Route   Pfizer COVID-19 Vaccine 12/04/2019 11:20 AM 0.3 mL 10/04/2019 Intramuscular   Manufacturer: [employer], [employer]   Lot: [id number]   NDC: [address]-9

## 2019-12-05 LAB — NOVEL CORONAVIRUS, NAA: SARS-CoV-2, NAA: NOT DETECTED

## 2020-07-23 ENCOUNTER — Ambulatory Visit: Payer: Medicare HMO | Attending: Internal Medicine

## 2020-07-23 DIAGNOSIS — Z23 Encounter for immunization: Secondary | ICD-10-CM

## 2020-07-23 NOTE — Progress Notes (Signed)
   Covid-19 Vaccination Clinic  Name:  Brandi Russell    MRN: [id number] DOB: [birth year]  07/23/2020  [name] was observed post Covid-19 immunization for 15 minutes without incident. She was provided with Vaccine Information Sheet and instruction to access the V-Safe system.   [name] was instructed to call 911 with any severe reactions post vaccine: Marland Kitchen Difficulty breathing  . Swelling of face and throat  . A fast heartbeat  . A bad rash all over body  . Dizziness and weakness

## 2020-08-03 ENCOUNTER — Other Ambulatory Visit: Payer: Self-pay

## 2020-08-03 DIAGNOSIS — I10 Essential (primary) hypertension: Secondary | ICD-10-CM

## 2020-08-03 MED ORDER — HYDROCHLOROTHIAZIDE 25 MG PO TABS: 25.0000 mg | ORAL_TABLET | Freq: Every day | ORAL | 3 refills | Status: DC

## 2020-08-13 ENCOUNTER — Other Ambulatory Visit: Payer: Medicare HMO

## 2020-08-13 DIAGNOSIS — Z20822 Contact with and (suspected) exposure to covid-19: Secondary | ICD-10-CM

## 2020-08-14 LAB — SARS-COV-2, NAA 2 DAY TAT

## 2020-08-14 LAB — NOVEL CORONAVIRUS, NAA: SARS-CoV-2, NAA: NOT DETECTED

## 2020-08-20 ENCOUNTER — Telehealth: Payer: Self-pay | Admitting: Cardiology

## 2020-08-20 NOTE — Telephone Encounter (Signed)
I did not need this encounter. °

## 2020-09-01 ENCOUNTER — Other Ambulatory Visit: Payer: Self-pay | Admitting: Cardiology

## 2020-09-07 NOTE — Progress Notes (Signed)
HPI: FUchest pain.Cardiac catheterization November 2017 showed a 30% left main, 20% right coronary artery and 20% PDA. Monitor March 2018 normal. Apparently seen with recurrent chest pain in the [facility] ER in January 2018. CTA showed no pulmonary embolus and enzymes negative.Echo August 2020 showed ejection fraction 60 to 65%, mild to moderate mitral regurgitation.  Since last seen,she denies increased dyspnea on exertion, orthopnea, PND or syncope.  She has an occasional sharp pain in her chest.  She takes Rolaids and also started Protonix with improvement.  She denies exertional chest pain.  Current Outpatient Medications  Medication Sig Dispense Refill   aspirin 81 MG tablet Take 81 mg by mouth daily.      atorvastatin (LIPITOR) 80 MG tablet Take 1 tablet (80 mg total) by mouth daily. 90 tablet 3   celecoxib (CELEBREX) 200 MG capsule Take 200 mg by mouth daily.     hydrochlorothiazide (HYDRODIURIL) 25 MG tablet Take 1 tablet (25 mg total) by mouth daily. 90 tablet 3   isosorbide mononitrate (IMDUR) 30 MG 24 hr tablet Take 1 tablet by mouth once daily 90 tablet 0   metoprolol tartrate (LOPRESSOR) 25 MG tablet TAKE 2 TABLETS BY MOUTH IN THE MORNING AND TAKE 1 TABLET IN THE EVENING 90 tablet 0   nitroGLYCERIN (NITROSTAT) 0.4 MG SL tablet DISSOLVE ONE TABLET UNDER THE TONGUE EVERY 5 MINUTES AS NEEDED FOR CHEST PAIN.  DO NOT EXCEED A TOTAL OF 3 DOSES IN 15 MINUTES 25 tablet 0   omeprazole (PRILOSEC) 20 MG capsule Take 20 mg by mouth daily.     oxybutynin (DITROPAN XL) 15 MG 24 hr tablet Take 15 mg by mouth daily.     potassium chloride (K-DUR,KLOR-CON) 10 MEQ tablet Take 1 tablet (10 mEq total) by mouth daily. (Patient taking differently: Take 10 mEq by mouth daily as needed. ) 90 tablet 3   scopolamine (TRANSDERM-SCOP) 1 MG/3DAYS Place 1 patch onto the skin every 3 (three) days.     SUMAtriptan (IMITREX) 25 MG tablet Take 25 mg by mouth every 2 (two) hours as needed for  migraine. May repeat in 2 hours if headache persists or recurs.     topiramate (TOPAMAX) 25 MG tablet Take 50 mg by mouth at bedtime.      torsemide (DEMADEX) 20 MG tablet Take 20 mg by mouth daily as needed.      valsartan (DIOVAN) 80 MG tablet Take 1 tablet (80 mg total) by mouth daily. (Patient taking differently: Take 80 mg by mouth daily. Patient cuts the tablets in 1/3) 30 tablet 5   No current facility-administered medications for this visit.     Past Medical History:  Diagnosis Date   Anemia    Arthritis    CAD (coronary artery disease)    a. 08/2016: minimal, nonobstructive CAD by cath with 30% LM stenosis, 20% Mid-RCA stenosis, and 20% RPDA stenosis.    Chest pain 09/13/2016   Contact lens/glasses fitting    wears glasses or contacts   Headache(784.0)    Heart murmur    Hyperlipemia    Hypertension     Past Surgical History:  Procedure Laterality Date   BUNIONECTOMY     both feet   CARDIAC CATHETERIZATION  2007   [place]   CARDIAC CATHETERIZATION N/A 09/14/2016   Procedure: Left Heart Cath and Coronary Angiography;  Surgeon: Rinaldo Cloud, MD;  Location: [facility];  Service: Cardiovascular;  Laterality: N/A;   COLONOSCOPY  HAMMERTOE RECONSTRUCTION WITH WEIL OSTEOTOMY  2006   rt foot   HAMMERTOE RECONSTRUCTION WITH WEIL OSTEOTOMY AND GASTROC SLIDE  09/26/2012   Procedure: HAMMERTOE RECONSTRUCTION WITH WEIL OSTEOTOMY AND GASTROC SLIDE;  Surgeon: Sherri Rad, MD;  Location: [facility];  Service: Orthopedics;  Laterality: Left;  LEFT GASTROC SLIDE, 2ND/3RD WEIL METATARSAL SHORTING OSTEOTOMIES, 2ND through 5th TOES MTP JOINT, DORSAL CAPSULOTOMY, HAMMER TOE RECONSTRUCTION,  FDL TO PROXIMAL PHALANX TENDON TRANSFER    Social History   Socioeconomic History   Marital status: Married    Spouse name: Not on file   Number of children: 2   Years of education: Not on file   Highest education level: Not on file   Occupational History   Not on file  Tobacco Use   Smoking status: Never Smoker   Smokeless tobacco: Never Used  Substance and Sexual Activity   Alcohol use: No   Drug use: No   Sexual activity: Not on file  Other Topics Concern   Not on file  Social History Narrative   Not on file   Social Determinants of Health   Financial Resource Strain:    Difficulty of Paying Living Expenses: Not on file  Food Insecurity:    Worried About Running Out of Food in the Last Year: Not on file   Ran Out of Food in the Last Year: Not on file  Transportation Needs:    Lack of Transportation (Medical): Not on file   Lack of Transportation (Non-Medical): Not on file  Physical Activity:    Days of Exercise per Week: Not on file   Minutes of Exercise per Session: Not on file  Stress:    Feeling of Stress : Not on file  Social Connections:    Frequency of Communication with Friends and Family: Not on file   Frequency of Social Gatherings with Friends and Family: Not on file   Attends Religious Services: Not on file   Active Member of Clubs or Organizations: Not on file   Attends Banker Meetings: Not on file   Marital Status: Not on file  Intimate Partner Violence:    Fear of Current or Ex-Partner: Not on file   Emotionally Abused: Not on file   Physically Abused: Not on file   Sexually Abused: Not on file    Family History  Problem Relation Age of Onset   Heart disease Mother        MI   CAD Father    CAD Brother    CAD Brother     ROS: no fevers or chills, productive cough, hemoptysis, dysphasia, odynophagia, melena, hematochezia, dysuria, hematuria, rash, seizure activity, orthopnea, PND, pedal edema, claudication. Remaining systems are negative.  Physical Exam: Well-developed well-nourished in no acute distress.  Skin is warm and dry.  HEENT is normal.  Neck is supple.  Chest is clear to auscultation with normal expansion.   Cardiovascular exam is regular rate and rhythm.  Abdominal exam nontender or distended. No masses palpated. Extremities show no edema. neuro grossly intact  ECG-normal sinus rhythm at a rate of 62, left ventricular hypertrophy, nonspecific ST changes.  Personally reviewed  A/P  1 chest pain-she has an occasional sharp pain in her chest that is improved with Rolaids and Protonix.  She does not have exertional chest pain.  Electrocardiogram shows no diagnostic ST changes.  We will consider a functional study in the future if her symptoms worsen.  2 hypertension-patient's blood pressure is controlled.  Continue present medications.  Check potassium and renal function.  3 hyperlipidemia-continue statin.  Check lipids and liver.  4 mild to moderate mitral regurgitation-repeat echocardiogram.  5 coronary artery disease-continue aspirin and statin.  Olga Millers, MD

## 2020-09-10 ENCOUNTER — Ambulatory Visit (INDEPENDENT_AMBULATORY_CARE_PROVIDER_SITE_OTHER): Payer: Medicare HMO | Admitting: Cardiology

## 2020-09-10 ENCOUNTER — Encounter: Payer: Self-pay | Admitting: Cardiology

## 2020-09-10 VITALS — BP 124/62 | HR 62 | Ht 63.0 in | Wt 187.0 lb

## 2020-09-10 DIAGNOSIS — E7801 Familial hypercholesterolemia: Secondary | ICD-10-CM | POA: Diagnosis not present

## 2020-09-10 DIAGNOSIS — I059 Rheumatic mitral valve disease, unspecified: Secondary | ICD-10-CM | POA: Diagnosis not present

## 2020-09-10 DIAGNOSIS — I10 Essential (primary) hypertension: Secondary | ICD-10-CM

## 2020-09-10 DIAGNOSIS — R079 Chest pain, unspecified: Secondary | ICD-10-CM | POA: Diagnosis not present

## 2020-09-10 NOTE — Patient Instructions (Signed)
  Lab Work:  Your physician recommends that you HAVE LAB WORK TODAY  If you have labs (blood work) drawn today and your tests are completely normal, you will receive your results only by: . MyChart Message (if you have MyChart) OR . A paper copy in the mail If you have any lab test that is abnormal or we need to change your treatment, we will call you to review the results.   Testing/Procedures:  Your physician has requested that you have an echocardiogram. Echocardiography is a painless test that uses sound waves to create images of your heart. It provides your doctor with information about the size and shape of your heart and how well your heart's chambers and valves are working. This procedure takes approximately one hour. There are no restrictions for this procedure.[address]     Follow-Up: At [facility], you and your health needs are our priority.  As part of our continuing mission to provide you with exceptional heart care, we have created designated Provider Care Teams.  These Care Teams include your primary Cardiologist (physician) and Advanced Practice Providers (APPs -  Physician Assistants and Nurse Practitioners) who all work together to provide you with the care you need, when you need it.  We recommend signing up for the patient portal called "MyChart".  Sign up information is provided on this After Visit Summary.  MyChart is used to connect with patients for Virtual Visits (Telemedicine).  Patients are able to view lab/test results, encounter notes, upcoming appointments, etc.  Non-urgent messages can be sent to your provider as well.   To learn more about what you can do with MyChart, go to [email or web address].    Your next appointment:   12 month(s)  The format for your next appointment:   In Person  Provider:   Brian Crenshaw, MD    

## 2020-09-11 ENCOUNTER — Other Ambulatory Visit: Payer: Self-pay | Admitting: *Deleted

## 2020-09-11 DIAGNOSIS — E785 Hyperlipidemia, unspecified: Secondary | ICD-10-CM

## 2020-09-11 LAB — COMPREHENSIVE METABOLIC PANEL
ALT: 15 IU/L (ref 0–32)
AST: 17 IU/L (ref 0–40)
Albumin/Globulin Ratio: 1.6 (ref 1.2–2.2)
Albumin: 4.4 g/dL (ref 3.8–4.8)
Alkaline Phosphatase: 125 IU/L — ABNORMAL HIGH (ref 44–121)
BUN/Creatinine Ratio: 19 (ref 12–28)
BUN: 17 mg/dL (ref 8–27)
Bilirubin Total: 1.2 mg/dL (ref 0.0–1.2)
CO2: 27 mmol/L (ref 20–29)
Calcium: 9.3 mg/dL (ref 8.7–10.3)
Chloride: 99 mmol/L (ref 96–106)
Creatinine, Ser: 0.9 mg/dL (ref 0.57–1.00)
GFR calc Af Amer: 77 mL/min/{1.73_m2} (ref >59)
GFR calc non Af Amer: 66 mL/min/{1.73_m2} (ref >59)
Globulin, Total: 2.7 g/dL (ref 1.5–4.5)
Glucose: 108 mg/dL — ABNORMAL HIGH (ref 65–99)
Potassium: 4 mmol/L (ref 3.5–5.2)
Sodium: 140 mmol/L (ref 134–144)
Total Protein: 7.1 g/dL (ref 6.0–8.5)

## 2020-09-11 LAB — LIPID PANEL
Chol/HDL Ratio: 3.7 ratio (ref 0.0–4.4)
Cholesterol, Total: 175 mg/dL (ref 100–199)
HDL: 47 mg/dL (ref >39)
LDL Chol Calc (NIH): 111 mg/dL — ABNORMAL HIGH (ref 0–99)
Triglycerides: 95 mg/dL (ref 0–149)
VLDL Cholesterol Cal: 17 mg/dL (ref 5–40)

## 2020-09-11 MED ORDER — EZETIMIBE 10 MG PO TABS: 10.0000 mg | ORAL_TABLET | Freq: Every day | ORAL | 3 refills | Status: DC

## 2020-09-11 NOTE — Telephone Encounter (Signed)
Brandi Bunting, MD  09/11/2020 7:19 AM EST Back to Top    Add zetia 10 mg daily; lipids and liver 12 weeks Olga Millers   Spoke with pt to review lab work and convey Dr. Ludwig Clarks orders. Pt instructed to fast for lab work in 12 weeks. Pt verbalizes understanding and agreement with plan.   Pharmacy and mailing address for lab paperwork confirmed with pt.

## 2020-09-22 ENCOUNTER — Other Ambulatory Visit (HOSPITAL_COMMUNITY): Payer: Medicare HMO

## 2020-09-24 ENCOUNTER — Other Ambulatory Visit: Payer: Medicare HMO

## 2020-09-24 ENCOUNTER — Other Ambulatory Visit: Payer: Self-pay

## 2020-09-24 ENCOUNTER — Ambulatory Visit (HOSPITAL_COMMUNITY)
Admission: RE | Admit: 2020-09-24 | Discharge: 2020-09-24 | Disposition: A | Discharge status: Home / Self Care | Payer: Medicare HMO | Source: Ambulatory Visit | Attending: Cardiology | Admitting: Cardiology

## 2020-09-24 DIAGNOSIS — Z20822 Contact with and (suspected) exposure to covid-19: Secondary | ICD-10-CM

## 2020-09-24 DIAGNOSIS — I059 Rheumatic mitral valve disease, unspecified: Secondary | ICD-10-CM | POA: Insufficient documentation

## 2020-09-24 DIAGNOSIS — I058 Other rheumatic mitral valve diseases: Secondary | ICD-10-CM

## 2020-09-24 LAB — ECHOCARDIOGRAM COMPLETE
Area-P 1/2: 3.24 cm2
S' Lateral: 2.8 cm

## 2020-09-24 MED ORDER — NITROGLYCERIN 0.4 MG SL SUBL
SUBLINGUAL_TABLET | SUBLINGUAL | 0 refills | Status: DC
Start: 2020-09-24 — End: 2021-11-09

## 2020-09-24 NOTE — Progress Notes (Signed)
Refill sent for nitroglycerin 0.4mg .

## 2020-09-24 NOTE — Progress Notes (Signed)
*  PRELIMINARY RESULTS* Echocardiogram 2D Echocardiogram has been performed.  Stacey Drain 09/24/2020, 9:39 AM

## 2020-09-26 LAB — NOVEL CORONAVIRUS, NAA

## 2020-12-04 ENCOUNTER — Encounter: Payer: Self-pay | Admitting: *Deleted

## 2020-12-14 ENCOUNTER — Other Ambulatory Visit: Payer: Self-pay | Admitting: Cardiology

## 2021-10-07 ENCOUNTER — Other Ambulatory Visit: Payer: Self-pay | Admitting: Cardiology

## 2021-10-07 DIAGNOSIS — I10 Essential (primary) hypertension: Secondary | ICD-10-CM

## 2021-10-26 NOTE — Progress Notes (Signed)
HPI: FU CAD. Cardiac catheterization November 2017 showed a 30% left main, 20% right coronary artery and 20% PDA. Monitor March 2018 normal. Apparently seen with recurrent chest pain in the [facility] ER in January 2018. CTA showed no pulmonary embolus and enzymes negative. Echo December 2021 showed ejection fraction 55 to 60%, mild left atrial enlargement.  Since last seen, she denies dyspnea on exertion, exertional chest pain or syncope.  Current Outpatient Medications  Medication Sig Dispense Refill   aspirin 81 MG tablet Take 81 mg by mouth daily.      atorvastatin (LIPITOR) 20 MG tablet Take 20 mg by mouth daily.     celecoxib (CELEBREX) 200 MG capsule Take 200 mg by mouth as needed.      ezetimibe (ZETIA) 10 MG tablet Take 1 tablet by mouth once daily 90 tablet 0   hydrochlorothiazide (HYDRODIURIL) 25 MG tablet Take 1 tablet by mouth once daily 90 tablet 0   isosorbide mononitrate (IMDUR) 30 MG 24 hr tablet Take 1 tablet by mouth once daily 90 tablet 3   metoprolol tartrate (LOPRESSOR) 25 MG tablet TAKE 2 TABLETS BY MOUTH IN THE MORNING AND TAKE 1 TABLET IN THE EVENING 90 tablet 0   nitroGLYCERIN (NITROSTAT) 0.4 MG SL tablet DISSOLVE ONE TABLET UNDER THE TONGUE EVERY 5 MINUTES AS NEEDED FOR CHEST PAIN.  DO NOT EXCEED A TOTAL OF 3 DOSES IN 15 MINUTES 25 tablet 0   omeprazole (PRILOSEC) 40 MG capsule Take 40 mg by mouth daily.     oxybutynin (DITROPAN XL) 15 MG 24 hr tablet Take 15 mg by mouth daily.     potassium chloride (K-DUR,KLOR-CON) 10 MEQ tablet Take 1 tablet (10 mEq total) by mouth daily. (Patient taking differently: Take 10 mEq by mouth daily as needed.) 90 tablet 3   scopolamine (TRANSDERM-SCOP) 1 MG/3DAYS Place 1 patch onto the skin every 3 (three) days.     SUMAtriptan (IMITREX) 25 MG tablet Take 25 mg by mouth every 2 (two) hours as needed for migraine. May repeat in 2 hours if headache persists or recurs.     topiramate (TOPAMAX) 25 MG tablet Take 50 mg by mouth at  bedtime.      torsemide (DEMADEX) 20 MG tablet Take 20 mg by mouth daily as needed.      valsartan (DIOVAN) 80 MG tablet Take 1 tablet (80 mg total) by mouth daily. 30 tablet 5   No current facility-administered medications for this visit.     Past Medical History:  Diagnosis Date   Anemia    Arthritis    CAD (coronary artery disease)    a. 08/2016: minimal, nonobstructive CAD by cath with 30% LM stenosis, 20% Mid-RCA stenosis, and 20% RPDA stenosis.    Chest pain 09/13/2016   Contact lens/glasses fitting    wears glasses or contacts   Headache(784.0)    Heart murmur    Hyperlipemia    Hypertension     Past Surgical History:  Procedure Laterality Date   BUNIONECTOMY     both feet   CARDIAC CATHETERIZATION  2007   [place]   CARDIAC CATHETERIZATION N/A 09/14/2016   Procedure: Left Heart Cath and Coronary Angiography;  Surgeon: Charolette Forward, MD;  Location: [facility];  Service: Cardiovascular;  Laterality: N/A;   COLONOSCOPY     HAMMERTOE RECONSTRUCTION WITH WEIL OSTEOTOMY  2006   rt foot   HAMMERTOE RECONSTRUCTION WITH WEIL OSTEOTOMY AND GASTROC SLIDE  09/26/2012   Procedure: HAMMERTOE  RECONSTRUCTION WITH WEIL OSTEOTOMY AND GASTROC SLIDE;  Surgeon: Colin Rhein, MD;  Location: [place];  Service: Orthopedics;  Laterality: Left;  LEFT GASTROC SLIDE, 2ND/3RD WEIL METATARSAL SHORTING OSTEOTOMIES, 2ND through 5th TOES MTP JOINT, DORSAL CAPSULOTOMY, HAMMER TOE RECONSTRUCTION,  FDL TO PROXIMAL PHALANX TENDON TRANSFER    Social History   Socioeconomic History   Marital status: Married    Spouse name: Not on file   Number of children: 2   Years of education: Not on file   Highest education level: Not on file  Occupational History   Not on file  Tobacco Use   Smoking status: Never   Smokeless tobacco: Never  Substance and Sexual Activity   Alcohol use: No   Drug use: No   Sexual activity: Not on file  Other Topics Concern   Not on file  Social  History Narrative   Not on file   Social Determinants of Health   Financial Resource Strain: Not on file  Food Insecurity: Not on file  Transportation Needs: Not on file  Physical Activity: Not on file  Stress: Not on file  Social Connections: Not on file  Intimate Partner Violence: Not on file    Family History  Problem Relation Age of Onset   Heart disease Mother        MI   CAD Father    CAD Brother    CAD Brother     ROS: no fevers or chills, productive cough, hemoptysis, dysphasia, odynophagia, melena, hematochezia, dysuria, hematuria, rash, seizure activity, orthopnea, PND, pedal edema, claudication. Remaining systems are negative.  Physical Exam: Well-developed well-nourished in no acute distress.  Skin is warm and dry.  HEENT is normal.  Neck is supple.  Chest is clear to auscultation with normal expansion.  Cardiovascular exam is regular rate and rhythm.  Abdominal exam nontender or distended. No masses palpated. Extremities show no edema. neuro grossly intact  ECG-normal sinus rhythm at a rate of 60, left ventricular hypertrophy, no ST changes.  Personally reviewed  A/P  1 chest pain-she has not had exertional chest pain.  Continue medical therapy.  2 hypertension-patient's blood pressure is controlled.  Continue present medical regimen.  3 hyperlipidemia-continue Zetia and statin.  Note higher doses of Lipitor in the past caused myalgias.  4 coronary artery disease-continue aspirin and statin.  Kirk Ruths, MD

## 2021-11-09 ENCOUNTER — Encounter: Payer: Self-pay | Admitting: Cardiology

## 2021-11-09 ENCOUNTER — Other Ambulatory Visit: Payer: Self-pay

## 2021-11-09 ENCOUNTER — Ambulatory Visit (INDEPENDENT_AMBULATORY_CARE_PROVIDER_SITE_OTHER): Payer: Medicare HMO | Admitting: Cardiology

## 2021-11-09 VITALS — BP 122/76 | HR 60 | Ht 63.0 in | Wt 187.0 lb

## 2021-11-09 DIAGNOSIS — I10 Essential (primary) hypertension: Secondary | ICD-10-CM | POA: Diagnosis not present

## 2021-11-09 DIAGNOSIS — E785 Hyperlipidemia, unspecified: Secondary | ICD-10-CM | POA: Diagnosis not present

## 2021-11-09 DIAGNOSIS — I2510 Atherosclerotic heart disease of native coronary artery without angina pectoris: Secondary | ICD-10-CM

## 2021-11-09 MED ORDER — ATORVASTATIN CALCIUM 20 MG PO TABS: 20.0000 mg | ORAL_TABLET | Freq: Every day | ORAL | 3 refills | Status: AC

## 2021-11-09 MED ORDER — METOPROLOL TARTRATE 25 MG PO TABS: ORAL_TABLET | ORAL | 3 refills | Status: AC

## 2021-11-09 MED ORDER — VALSARTAN 80 MG PO TABS
80.0000 mg | ORAL_TABLET | Freq: Every day | ORAL | 3 refills | Status: AC
Start: 2021-11-09 — End: ?

## 2021-11-09 MED ORDER — ISOSORBIDE MONONITRATE ER 30 MG PO TB24: 30.0000 mg | ORAL_TABLET | Freq: Every day | ORAL | 3 refills | Status: DC

## 2021-11-09 MED ORDER — NITROGLYCERIN 0.4 MG SL SUBL: SUBLINGUAL_TABLET | SUBLINGUAL | 3 refills | Status: DC

## 2021-11-09 MED ORDER — TORSEMIDE 20 MG PO TABS: 20.0000 mg | ORAL_TABLET | Freq: Every day | ORAL | 0 refills | Status: AC | PRN

## 2021-11-09 MED ORDER — EZETIMIBE 10 MG PO TABS: 10.0000 mg | ORAL_TABLET | Freq: Every day | ORAL | 3 refills | Status: DC

## 2021-11-09 MED ORDER — HYDROCHLOROTHIAZIDE 25 MG PO TABS: 25.0000 mg | ORAL_TABLET | Freq: Every day | ORAL | 3 refills | Status: DC

## 2021-11-09 NOTE — Patient Instructions (Signed)

## 2022-09-01 ENCOUNTER — Telehealth: Payer: Self-pay | Admitting: Cardiology

## 2022-09-01 NOTE — Telephone Encounter (Signed)
Patient called stating the tips of her fingers are gettting blue and white, and cold.  This happens to both hands, but not at the same time.  When the tips turn blue her fingers get numb.  She states she is not having any other symptoms. She states this has been going on for a little over a week.

## 2022-09-01 NOTE — Telephone Encounter (Signed)
Spoke with pt, she reports the symptoms will last from 15 to 20 minutes. She reports taking metoprolol 2 tablets in the evening, she is unsure if succ or tartrate. She has been taking the metoprolol for 10-20 years now so do not think it is due to that. Aware do not know what that could be, she does not have any issues with carpel tunnel or other problems with her wrist. Advised patient to get in contact with her medical doctor.

## 2022-12-27 ENCOUNTER — Other Ambulatory Visit: Payer: Self-pay | Admitting: Cardiology

## 2022-12-27 DIAGNOSIS — I10 Essential (primary) hypertension: Secondary | ICD-10-CM

## 2023-01-28 ENCOUNTER — Other Ambulatory Visit: Payer: Self-pay | Admitting: Cardiology

## 2023-04-25 ENCOUNTER — Other Ambulatory Visit: Payer: Self-pay | Admitting: Cardiology

## 2023-06-20 NOTE — Progress Notes (Deleted)
HPI: FU CAD. Cardiac catheterization November 2017 showed a 30% left main, 20% right coronary artery and 20% PDA. Monitor March 2018 normal. Apparently seen with recurrent chest pain in the [facility] ER in January 2018. CTA showed no pulmonary embolus and enzymes negative. Echo December 2021 showed ejection fraction 55 to 60%, mild left atrial enlargement.  Since last seen,    Current Outpatient Medications  Medication Sig Dispense Refill   aspirin 81 MG tablet Take 81 mg by mouth daily.      atorvastatin (LIPITOR) 20 MG tablet Take 1 tablet (20 mg total) by mouth daily. 90 tablet 3   celecoxib (CELEBREX) 200 MG capsule Take 200 mg by mouth as needed.      ezetimibe (ZETIA) 10 MG tablet Take 1 tablet (10 mg total) by mouth daily. Patient needs to keep upcoming appointment with provider. 90 tablet 0   hydrochlorothiazide (HYDRODIURIL) 25 MG tablet Take 1 tablet by mouth once daily 90 tablet 0   isosorbide mononitrate (IMDUR) 30 MG 24 hr tablet Take 1 tablet (30 mg total) by mouth daily. 90 tablet 3   metoprolol tartrate (LOPRESSOR) 25 MG tablet TAKE 2 TABLETS BY MOUTH IN THE MORNING AND TAKE 1 TABLET IN THE EVENING 270 tablet 3   nitroGLYCERIN (NITROSTAT) 0.4 MG SL tablet DISSOLVE ONE TABLET UNDER THE TONGUE EVERY 5 MINUTES AS NEEDED FOR CHEST PAIN.  DO NOT EXCEED A TOTAL OF 3 DOSES IN 15 MINUTES 25 tablet 3   omeprazole (PRILOSEC) 40 MG capsule Take 40 mg by mouth daily.     oxybutynin (DITROPAN XL) 15 MG 24 hr tablet Take 15 mg by mouth daily.     potassium chloride (K-DUR,KLOR-CON) 10 MEQ tablet Take 1 tablet (10 mEq total) by mouth daily. (Patient taking differently: Take 10 mEq by mouth daily as needed.) 90 tablet 3   scopolamine (TRANSDERM-SCOP) 1 MG/3DAYS Place 1 patch onto the skin every 3 (three) days.     SUMAtriptan (IMITREX) 25 MG tablet Take 25 mg by mouth every 2 (two) hours as needed for migraine. May repeat in 2 hours if headache persists or recurs.     topiramate (TOPAMAX) 25  MG tablet Take 50 mg by mouth at bedtime.      torsemide (DEMADEX) 20 MG tablet Take 1 tablet (20 mg total) by mouth daily as needed. 90 tablet 0   valsartan (DIOVAN) 80 MG tablet Take 1 tablet (80 mg total) by mouth daily. 90 tablet 3   No current facility-administered medications for this visit.     Past Medical History:  Diagnosis Date   Anemia    Arthritis    CAD (coronary artery disease)    a. 08/2016: minimal, nonobstructive CAD by cath with 30% LM stenosis, 20% Mid-RCA stenosis, and 20% RPDA stenosis.    Chest pain 09/13/2016   Contact lens/glasses fitting    wears glasses or contacts   Headache(784.0)    Heart murmur    Hyperlipemia    Hypertension     Past Surgical History:  Procedure Laterality Date   BUNIONECTOMY     both feet   CARDIAC CATHETERIZATION  2007   [place]   CARDIAC CATHETERIZATION N/A 09/14/2016   Procedure: Left Heart Cath and Coronary Angiography;  Surgeon: Rinaldo Cloud, MD;  Location: [facility];  Service: Cardiovascular;  Laterality: N/A;   COLONOSCOPY     HAMMERTOE RECONSTRUCTION WITH WEIL OSTEOTOMY  2006   rt foot   HAMMERTOE RECONSTRUCTION WITH WEIL  OSTEOTOMY AND GASTROC SLIDE  09/26/2012   Procedure: HAMMERTOE RECONSTRUCTION WITH WEIL OSTEOTOMY AND GASTROC SLIDE;  Surgeon: Sherri Rad, MD;  Location: [facility];  Service: Orthopedics;  Laterality: Left;  LEFT GASTROC SLIDE, 2ND/3RD WEIL METATARSAL SHORTING OSTEOTOMIES, 2ND through 5th TOES MTP JOINT, DORSAL CAPSULOTOMY, HAMMER TOE RECONSTRUCTION,  FDL TO PROXIMAL PHALANX TENDON TRANSFER    Social History   Socioeconomic History   Marital status: Married    Spouse name: Not on file   Number of children: 2   Years of education: Not on file   Highest education level: Not on file  Occupational History   Not on file  Tobacco Use   Smoking status: Never   Smokeless tobacco: Never  Substance and Sexual Activity   Alcohol use: No   Drug use: No   Sexual activity:  Not on file  Other Topics Concern   Not on file  Social History Narrative   Not on file   Social Determinants of Health   Financial Resource Strain: Not on file  Food Insecurity: Not on file  Transportation Needs: Not on file  Physical Activity: Not on file  Stress: Not on file  Social Connections: Not on file  Intimate Partner Violence: Not on file    Family History  Problem Relation Age of Onset   Heart disease Mother        MI   CAD Father    CAD Brother    CAD Brother     ROS: no fevers or chills, productive cough, hemoptysis, dysphasia, odynophagia, melena, hematochezia, dysuria, hematuria, rash, seizure activity, orthopnea, PND, pedal edema, claudication. Remaining systems are negative.  Physical Exam: Well-developed well-nourished in no acute distress.  Skin is warm and dry.  HEENT is normal.  Neck is supple.  Chest is clear to auscultation with normal expansion.  Cardiovascular exam is regular rate and rhythm.  Abdominal exam nontender or distended. No masses palpated. Extremities show no edema. neuro grossly intact  ECG- personally reviewed  A/P  1 coronary artery disease-nonobstructive on previous catheterization.  He denies exertional symptoms.  Will continue medical therapy with aspirin and statin.  2 hypertension-blood pressure controlled.  Continue present medications.  3 hyperlipidemia-continue Lipitor and Zetia at present dose.  Higher doses of statin previously caused myalgias.  4 history of chest pain-  Olga Millers, MD

## 2023-06-29 ENCOUNTER — Ambulatory Visit: Admitting: Cardiology

## 2023-07-27 ENCOUNTER — Other Ambulatory Visit: Payer: Self-pay | Admitting: Cardiology

## 2023-12-05 ENCOUNTER — Other Ambulatory Visit: Payer: Self-pay | Admitting: Cardiology

## 2023-12-20 ENCOUNTER — Telehealth: Payer: Self-pay | Admitting: Cardiology

## 2023-12-20 MED ORDER — EZETIMIBE 10 MG PO TABS: 10.0000 mg | ORAL_TABLET | Freq: Every day | ORAL | 0 refills | Status: DC

## 2023-12-20 NOTE — Telephone Encounter (Signed)
*  STAT* If patient is at the pharmacy, call can be transferred to refill team.   1. Which medications need to be refilled? (please list name of each medication and dose if known)  ezetimibe (ZETIA) 10 MG tablet  2. Which pharmacy/location (including street and city if local pharmacy) is medication to be sent to? [facility] [address] [place] - [address]   3. Do they need a 30 day or 90 day supply? 90

## 2023-12-27 ENCOUNTER — Telehealth: Payer: Self-pay | Admitting: *Deleted

## 2023-12-27 NOTE — Telephone Encounter (Signed)
   Name: Brandi Russell  DOB: May 23, 1951  MRN: [id number]  Primary Cardiologist: Olga Millers, MD  Chart reviewed as part of pre-operative protocol coverage. The patient has an upcoming visit scheduled with Dr. Jens Som on 04/08/2024 at which time clearance can be addressed in case there are any issues that would impact surgical recommendations.  (Patient has not been seen in office since 11/09/2021)   I added preop FYI to appointment note so that provider is aware to address at time of outpatient visit.  Per office protocol the cardiology provider should forward their finalized clearance decision and recommendations regarding antiplatelet therapy to the requesting party below.    I will route this message as FYI to requesting party and remove this message from the preop box as separate preop APP input not needed at this time.   Please call with any questions.  Napoleon Form, Leodis Rains, NP  12/27/2023, 11:50 AM

## 2023-12-27 NOTE — Telephone Encounter (Signed)
   Pre-operative Risk Assessment    Patient Name: ARIETTA EISENSTEIN  DOB: 1950-10-30 MRN: [id number]   Date of last office visit: 11/09/21 DR. CRENSHAW Date of next office visit: 04/08/24 DR. CRENSHAW   Request for Surgical Clearance    Procedure:   RIGHT 1ST MTP FUSION, POSSIBLE ALLOGRAFT  Date of Surgery:  Clearance TBD                                Surgeon:  DR. Valli Glance Surgeon's Group or Practice Name:  [facility] Phone number:  [phone] ME[facility] DAV[facility] Fax number:  [phone]   Type of Clearance Requested:   - Medical  - Pharmacy:  Hold Aspirin     Type of Anesthesia:  General    Additional requests/questions:    Elpidio Anis   12/27/2023, 11:43 AM

## 2024-03-25 NOTE — Progress Notes (Signed)
 HPI: FU CAD. Cardiac catheterization November 2017 showed 30% left main, 20% right coronary artery and 20% PDA. Monitor March 2018 normal. CTA 1/18 showed no pulmonary embolus. Echo December 2021 showed ejection fraction 55 to 60%, mild left atrial enlargement.  Patient is scheduled for rotator cuff surgery and cardiology asked to evaluate preoperatively.  Since last seen, the patient denies any dyspnea on exertion, orthopnea, PND, pedal edema, palpitations, syncope or chest pain.   Current Outpatient Medications  Medication Sig Dispense Refill   aspirin  81 MG tablet Take 81 mg by mouth daily.      atorvastatin  (LIPITOR) 20 MG tablet Take 1 tablet (20 mg total) by mouth daily. 90 tablet 3   celecoxib (CELEBREX) 200 MG capsule Take 200 mg by mouth as needed.      ezetimibe  (ZETIA ) 10 MG tablet Take 1 tablet by mouth once daily 30 tablet 0   hydrochlorothiazide  (HYDRODIURIL ) 25 MG tablet Take 1 tablet by mouth once daily 90 tablet 0   isosorbide  mononitrate (IMDUR ) 30 MG 24 hr tablet Take 1 tablet (30 mg total) by mouth daily. 90 tablet 3   nitroGLYCERIN  (NITROSTAT ) 0.4 MG SL tablet DISSOLVE ONE TABLET UNDER THE TONGUE EVERY 5 MINUTES AS NEEDED FOR CHEST PAIN.  DO NOT EXCEED A TOTAL OF 3 DOSES IN 15 MINUTES 25 tablet 3   omeprazole (PRILOSEC) 40 MG capsule Take 40 mg by mouth daily.     oxybutynin (DITROPAN XL) 15 MG 24 hr tablet Take 15 mg by mouth daily.     potassium chloride  (K-DUR,KLOR-CON ) 10 MEQ tablet Take 1 tablet (10 mEq total) by mouth daily. (Patient taking differently: Take 10 mEq by mouth daily as needed.) 90 tablet 3   scopolamine (TRANSDERM-SCOP) 1 MG/3DAYS Place 1 patch onto the skin every 3 (three) days.     SUMAtriptan (IMITREX) 25 MG tablet Take 25 mg by mouth every 2 (two) hours as needed for migraine. May repeat in 2 hours if headache persists or recurs.     topiramate  (TOPAMAX ) 25 MG tablet Take 50 mg by mouth at bedtime.      torsemide  (DEMADEX ) 20 MG tablet Take 1  tablet (20 mg total) by mouth daily as needed. 90 tablet 0   valsartan  (DIOVAN ) 80 MG tablet Take 1 tablet (80 mg total) by mouth daily. 90 tablet 3   metoprolol  tartrate (LOPRESSOR ) 25 MG tablet TAKE 2 TABLETS BY MOUTH IN THE MORNING AND TAKE 1 TABLET IN THE EVENING (Patient not taking: Reported on 04/08/2024) 270 tablet 3   No current facility-administered medications for this visit.     Past Medical History:  Diagnosis Date   Anemia    Arthritis    CAD (coronary artery disease)    a. 08/2016: minimal, nonobstructive CAD by cath with 30% LM stenosis, 20% Mid-RCA stenosis, and 20% RPDA stenosis.    Chest pain 09/13/2016   Contact lens/glasses fitting    wears glasses or contacts   Headache(784.0)    Heart murmur    Hyperlipemia    Hypertension     Past Surgical History:  Procedure Laterality Date   BUNIONECTOMY     both feet   CARDIAC CATHETERIZATION  2007   [place]   CARDIAC CATHETERIZATION N/A 09/14/2016   Procedure: Left Heart Cath and Coronary Angiography;  Surgeon: Chapman Commodore, MD;  Location: [facility];  Service: Cardiovascular;  Laterality: N/A;   COLONOSCOPY     HAMMERTOE RECONSTRUCTION WITH WEIL OSTEOTOMY  2006  rt foot   HAMMERTOE RECONSTRUCTION WITH WEIL OSTEOTOMY AND GASTROC SLIDE  09/26/2012   Procedure: HAMMERTOE RECONSTRUCTION WITH WEIL OSTEOTOMY AND GASTROC SLIDE;  Surgeon: Janifer Meigs, MD;  Location: [facility];  Service: Orthopedics;  Laterality: Left;  LEFT GASTROC SLIDE, 2ND/3RD WEIL METATARSAL SHORTING OSTEOTOMIES, 2ND through 5th TOES MTP JOINT, DORSAL CAPSULOTOMY, HAMMER TOE RECONSTRUCTION,  FDL TO PROXIMAL PHALANX TENDON TRANSFER    Social History   Socioeconomic History   Marital status: Married    Spouse name: Not on file   Number of children: 2   Years of education: Not on file   Highest education level: Not on file  Occupational History   Not on file  Tobacco Use   Smoking status: Never   Smokeless tobacco:  Never  Substance and Sexual Activity   Alcohol use: No   Drug use: No   Sexual activity: Not on file  Other Topics Concern   Not on file  Social History Narrative   Not on file   Social Drivers of Health   Financial Resource Strain: Not on file  Food Insecurity: Not on file  Transportation Needs: Not on file  Physical Activity: Not on file  Stress: Not on file  Social Connections: Not on file  Intimate Partner Violence: Not on file    Family History  Problem Relation Age of Onset   Heart disease Mother        MI   CAD Father    CAD Brother    CAD Brother     ROS: no fevers or chills, productive cough, hemoptysis, dysphasia, odynophagia, melena, hematochezia, dysuria, hematuria, rash, seizure activity, orthopnea, PND, pedal edema, claudication. Remaining systems are negative.  Physical Exam: Well-developed well-nourished in no acute distress.  Skin is warm and dry.  HEENT is normal.  Neck is supple.  Chest is clear to auscultation with normal expansion.  Cardiovascular exam is regular rate and rhythm.  Abdominal exam nontender or distended. No masses palpated. Extremities show no edema. neuro grossly intact  EKG Interpretation Date/Time:  Monday April 08 2024 11:36:28 EDT Ventricular Rate:  62 PR Interval:  152 QRS Duration:  78 QT Interval:  434 QTC Calculation: 440 R Axis:   1  Text Interpretation: Normal sinus rhythm Minimal voltage criteria for LVH, may be normal variant ( R in aVL ) Confirmed by Alexandria Angel ([id number]) on 04/08/2024 11:40:06 AM    A/P  1 coronary artery disease-patient denies exertional chest pain.  Continue medical therapy with aspirin  and statin.  2 preoperative evaluation-prior to rotator cuff surgery.  Patient may require rotator cuff surgery or right foot surgery.  She is unclear if she will proceed with this as steroid injections have improved her symptoms.  However she has excellent functional capacity and does not have exertional  chest pain.  She may proceed without further cardiac evaluation if needed.  3 hypertension-blood pressure borderline but controlled at home.  Will follow.  4 hyperlipidemia-continue Lipitor and Zetia .  She had myalgias with higher doses of Lipitor previously.  Will have most recent lipids and liver forwarded to us  from primary care.  Alexandria Angel, MD

## 2024-03-30 ENCOUNTER — Other Ambulatory Visit: Payer: Self-pay | Admitting: Cardiology

## 2024-04-08 ENCOUNTER — Ambulatory Visit: Attending: Cardiology | Admitting: Cardiology

## 2024-04-08 ENCOUNTER — Encounter: Payer: Self-pay | Admitting: Cardiology

## 2024-04-08 VITALS — BP 140/86 | HR 62 | Ht 63.0 in | Wt 155.0 lb

## 2024-04-08 DIAGNOSIS — Z01810 Encounter for preprocedural cardiovascular examination: Secondary | ICD-10-CM | POA: Diagnosis not present

## 2024-04-08 DIAGNOSIS — I10 Essential (primary) hypertension: Secondary | ICD-10-CM | POA: Diagnosis not present

## 2024-04-08 DIAGNOSIS — I2510 Atherosclerotic heart disease of native coronary artery without angina pectoris: Secondary | ICD-10-CM

## 2024-04-08 DIAGNOSIS — E7800 Pure hypercholesterolemia, unspecified: Secondary | ICD-10-CM | POA: Diagnosis not present

## 2024-04-08 MED ORDER — ISOSORBIDE MONONITRATE ER 30 MG PO TB24: 30.0000 mg | ORAL_TABLET | Freq: Every day | ORAL | 3 refills | Status: AC

## 2024-04-08 MED ORDER — HYDROCHLOROTHIAZIDE 25 MG PO TABS: 25.0000 mg | ORAL_TABLET | Freq: Every day | ORAL | 3 refills | Status: AC

## 2024-04-08 MED ORDER — EZETIMIBE 10 MG PO TABS: 10.0000 mg | ORAL_TABLET | Freq: Every day | ORAL | 3 refills | Status: AC

## 2024-04-08 MED ORDER — NITROGLYCERIN 0.4 MG SL SUBL: SUBLINGUAL_TABLET | SUBLINGUAL | 3 refills | Status: AC

## 2024-04-08 NOTE — Patient Instructions (Signed)
